# Patient Record
Sex: Female | Born: 1965 | Race: White | Hispanic: No | Smoking: Former smoker
Health system: Southern US, Community
[De-identification: ages and names within clinical notes are randomized; demographics above are authoritative.]

---

## 1997-03-17 ENCOUNTER — Inpatient Hospital Stay (HOSPITAL_COMMUNITY): Admission: AD | Admit: 1997-03-17 | Discharge: 1997-03-19 | Payer: Self-pay | Admitting: Gynecology

## 1997-03-25 ENCOUNTER — Encounter: Admission: RE | Admit: 1997-03-25 | Discharge: 1997-04-11 | Payer: Self-pay | Admitting: Obstetrics and Gynecology

## 1998-05-04 ENCOUNTER — Other Ambulatory Visit: Admission: RE | Admit: 1998-05-04 | Discharge: 1998-05-04 | Payer: Self-pay | Admitting: Gynecology

## 2002-10-01 ENCOUNTER — Other Ambulatory Visit: Admission: RE | Admit: 2002-10-01 | Discharge: 2002-10-01 | Payer: Self-pay | Admitting: Gynecology

## 2003-04-08 ENCOUNTER — Inpatient Hospital Stay (HOSPITAL_COMMUNITY): Admission: AD | Admit: 2003-04-08 | Discharge: 2003-04-12 | Payer: Self-pay | Admitting: Gynecology

## 2003-04-09 ENCOUNTER — Encounter (INDEPENDENT_AMBULATORY_CARE_PROVIDER_SITE_OTHER): Payer: Self-pay | Admitting: Specialist

## 2003-05-23 ENCOUNTER — Other Ambulatory Visit: Admission: RE | Admit: 2003-05-23 | Discharge: 2003-05-23 | Payer: Self-pay | Admitting: Gynecology

## 2003-11-22 ENCOUNTER — Other Ambulatory Visit: Admission: RE | Admit: 2003-11-22 | Discharge: 2003-11-22 | Payer: Self-pay | Admitting: Gynecology

## 2009-01-16 ENCOUNTER — Ambulatory Visit: Payer: Self-pay | Admitting: Internal Medicine

## 2009-01-16 DIAGNOSIS — R109 Unspecified abdominal pain: Secondary | ICD-10-CM | POA: Insufficient documentation

## 2010-06-29 NOTE — Discharge Summary (Signed)
Katherine Webster, Katherine Webster                         ACCOUNT NO.:  [id number]   MEDICAL RECORD NO.:  [id number]                   PATIENT TYPE:  INP   LOCATION:  9143                                 FACILITY:  [facility]   PHYSICIAN:  Juan H. Lily Peer, M.D.             DATE OF BIRTH:  11-Sep-1965   DATE OF ADMISSION:  04/08/2003  DATE OF DISCHARGE:  04/12/2003                                 DISCHARGE SUMMARY   DISCHARGE DIAGNOSES:  1. Intrauterine pregnancy at 38 weeks delivered.  2. Breech presentation.  3. Undesired fertility.  4. Status post primary low transverse cesarean section and bilateral tubal     ligation modified Pomeroy type by Dr. Loreli Dollar on April 09, 2003.  5. Rh negative.   HISTORY:  This is a 37-years-of-age female gravida 3 para 1 with EDC of  April 21, 2003.  Prenatal course was uncomplicated except for being Rh  negative and received RhoGAM during pregnancy.   HOSPITAL COURSE:  On April 09, 2003 the patient presented in labor.  She  was admitted and was found, however, to be breech presentation and therefore  secondary to same underwent primary low transverse cesarean section and  bilateral tubal ligation modified Pomeroy technique by Dr. Loreli Dollar  on April 09, 2003 without complication.  The patient underwent delivery  of a female, Apgars of 8 and 9, weight of 6 pounds 15 ounces.  Postoperatively  the patient remained afebrile, voiding, in stable condition, and she was  discharged to home on April 12, 2003 and given [facility]  postpartum instructions.   ACCESSORY CLINICAL FINDINGS/LABORATORY DATA:  The patient is O negative,  rubella immune.  On April 10, 2003 hemoglobin was 9.1.   DISPOSITION:  The patient was discharged to home, informed to return to the  office in 6 weeks, if had any problem prior to that time to be seen in the  office, given a prescription for Tylox p.r.n. pain, was to take iron on a  daily basis, and the  patient had received RhoGAM prior to discharge.     Susa Loffler, P.A.                    Juan H. Lily Peer, M.D.    TSG/MEDQ  D:  05/02/2003  T:  05/02/2003  Job:  [id number]

## 2010-06-29 NOTE — Op Note (Signed)
Katherine Webster, Katherine Webster                         ACCOUNT NO.:  [id number]   MEDICAL RECORD NO.:  [id number]                   PATIENT TYPE:  INP   LOCATION:  9143                                 FACILITY:  [facility]   PHYSICIAN:  Charles A. Sydnee Cabal, MD            DATE OF BIRTH:  1965/11/16   DATE OF PROCEDURE:  04/09/2003  DATE OF DISCHARGE:                                 OPERATIVE REPORT   PREOPERATIVE DIAGNOSES:  1. Intrauterine pregnancy 38-1/2 weeks.  2. Breech lie.  3. Undesired fertility.   POSTOPERATIVE DIAGNOSES:  1. Intrauterine pregnancy 38-1/2 weeks.  2. Breech lie.  3. Undesired fertility.   PROCEDURES:  1. Primary low transverse cesarean section.  2. Bilateral tubal ligation, modified Pomeroy type.   SURGEON:  Charles A. Sydnee Cabal, MD.   ASSISTANT:  Conni Elliot, M.D.   COMPLICATIONS:  None.   ESTIMATED BLOOD LOSS:  700 mL.   OPERATIVE FINDINGS:  Vigorous female, Apgars 8 and 9, normal tubes and  ovaries.  Very small fundal leiomyoma.   ANESTHESIA:  Epidural/Duramorph.   INSTRUMENT, SPONGE AND NEEDLE COUNT:  Correct x 2.   DESCRIPTION OF PROCEDURE:  The patient was taken to the operating room,  placed in supine position.  Anesthesia had been dosed and was adequate.  Sterile prep and drape was undertaken.  Pfannenstiel incision was made with  a knife, carried down to fascia.  Fascia was incised with the knife and Mayo  scissors.  Rectus sheath were sharply dissected superiorly and inferiorly.  Rectus muscles were sharply dissected in the midline; peritoneum was entered  with a hemostat, and peritoneum was stretched to extend the incision.  Bladder blade was placed.  The vesicouterine peritoneum was incised with the  Metzenbaum scissors.  Blunt dissection was used to develop the bladder flap.  Bladder blade was re-placed in the lower uterine segment.  Transverse  incision was made to amniotomy.  Traction was used to extend the incision;  hand was inserted.   Breech was lifted out of the operative field, and fundal  pressure by the operator's assistant pushed the baby out to the chest level.  The legs were flexed and delivered without difficulty.  The baby was rotated  on its reflex without difficulty, and the head spontaneously delivered.  Cord was clamped and cut, and infant was handed off to neonatologist in  attendance.  Manual expression of the placenta was effected.  Placenta was  removed, and the intrauterine cavity was wiped with moistened lap.  Then #1  chromic was used in a running lock suture, first layer then second layer  running imbricating, nonlocking over the first.  Figure-of-eight suture was  used at the left angle as well as the right angle to achieve adequate  hemostasis using 0 Vicryl.  Hemostasis was verified, and paracolic gutters  were cleansed of clotted blood material, and incisional hemostasis was  verified.  Bladder flap hemostasis was  good.  Attention was then turned to  the tubal ligation.  Left tube was grasped with Babcock forceps; middle  third was ligated with 0 plain gut, excised, and handed off to nursing to go  to pathology.  Pedicle was of good hemostasis with minor electrocautery on  the tube ends.  Attention was turned to the right tube.  In like fashion,  middle third was grasped with Babcock forceps.  Two ties of 0 plain gut were  placed.  Tubal segment was excised.  Cautery was used on the tubal ends, and  segment was handed off to nursing to go to pathology.  Pedicles were  revisualized.  Hemostasis was good.  Incisional hemostasis was good.  Subfascial hemostasis was good.  The fascia was closed with #1 Vicryl  running nonlocking suture.  Subcutaneous hemostasis, was irrigated.  Bladder  flap had been irrigated as well earlier.  Prior to closure of the fascia,  hemostasis was good in the subcutaneous tissues, and sterile skin clips were  used to close the skin.  Sterile dressing was applied.  The  patient was  taken from the operating room with physician in attendance.                                               Charles A. Sydnee Cabal, MD    CAD/MEDQ  D:  04/09/2003  T:  04/09/2003  Job:  [id number]

## 2011-04-10 ENCOUNTER — Other Ambulatory Visit: Payer: Self-pay | Admitting: Obstetrics and Gynecology

## 2017-02-11 HISTORY — PX: PLACEMENT OF BREAST IMPLANTS: SHX6334

## 2017-02-11 HISTORY — PX: LIPOSUCTION: SHX10

## 2021-01-23 ENCOUNTER — Encounter: Payer: Self-pay | Admitting: Student

## 2021-01-23 ENCOUNTER — Other Ambulatory Visit: Payer: Self-pay

## 2021-01-23 ENCOUNTER — Ambulatory Visit (INDEPENDENT_AMBULATORY_CARE_PROVIDER_SITE_OTHER): Payer: Self-pay | Admitting: Student

## 2021-01-23 ENCOUNTER — Ambulatory Visit (INDEPENDENT_AMBULATORY_CARE_PROVIDER_SITE_OTHER): Payer: Managed Care, Other (non HMO)

## 2021-01-23 VITALS — BP 124/66 | HR 77 | Temp 98.1°F | Ht 63.0 in | Wt 215.4 lb

## 2021-01-23 DIAGNOSIS — R0609 Other forms of dyspnea: Secondary | ICD-10-CM | POA: Diagnosis not present

## 2021-01-23 NOTE — Patient Instructions (Addendum)
-   PFTs and clinic visit in 1-2 weeks - CXR and lab today

## 2021-01-23 NOTE — Progress Notes (Signed)
Synopsis: Referred for DOE by No ref. provider found  Subjective:   PATIENT ID: Katherine Webster GENDER: female DOB: 1965/10/18, MRN: [id number]  Chief Complaint  Patient presents with   Pulmonary Consult    Referred by Dr Marcelle Overlie. Pt c/o DOE x 3 years. She gets winded with exertion such as mowing her yard. She walks on her treadmill about 3 x per wk. She has wheezing when she lies down.    55yF with no significant PMH who is here for DOE. Never had asthma. Never had covid-19 infection.  Three years of DOE with strenuous exertion. On treadmill 30 min/day walking and takes it pretty easy due to fear of DOE with strenuous exertion. Has some wheezing when she lies down. She has no cough. She has no heartburn/indigestion. No throat tightness or cough in association with her DOE, palpitations, CP. No orthopnea in general.   She doesn't snore. No PND. No excessive daytime sleepiness.   Otherwise pertinent review of systems is negative.  No family history of lung disease  She works from home. She has never lived outside of [place]. No recent travel. She has 2 cats. She smoked for 12 years half ppd, quit 1995.     No past medical history on file.   Family History  Problem Relation Age of Onset   Breast cancer Maternal Aunt    Bladder Cancer Maternal Aunt      Past Surgical History:  Procedure Laterality Date   LIPOSUCTION N/A 02/11/2017   PLACEMENT OF BREAST IMPLANTS Bilateral 02/11/2017    Social History   Socioeconomic History   Marital status: Widowed    Spouse name: Not on file   Number of children: Not on file   Years of education: Not on file   Highest education level: Not on file  Occupational History   Not on file  Tobacco Use   Smoking status: Former    Packs/day: 0.50    Years: 12.00    Pack years: 6.00    Types: Cigarettes    Quit date: 02/11/1993    Years since quitting: 27.9   Smokeless tobacco: Never  Vaping Use   Vaping Use: Never used  Substance  and Sexual Activity   Alcohol use: Not on file   Drug use: Not on file   Sexual activity: Not on file  Other Topics Concern   Not on file  Social History Narrative   Not on file   Social Determinants of Health   Financial Resource Strain: Not on file  Food Insecurity: Not on file  Transportation Needs: Not on file  Physical Activity: Not on file  Stress: Not on file  Social Connections: Not on file  Intimate Partner Violence: Not on file     Allergies  Allergen Reactions   Erythromycin      Outpatient Medications Prior to Visit  Medication Sig Dispense Refill   acetaminophen (TYLENOL) 325 MG tablet Take 650 mg by mouth every 6 (six) hours as needed.     No facility-administered medications prior to visit.       Objective:   Physical Exam:  General appearance: 55 y.o., female, NAD, conversant  Eyes: anicteric sclerae; PERRL, tracking appropriately HENT: NCAT; MMM Neck: Trachea midline; no lymphadenopathy, no JVD Lungs: CTAB, no crackles, no wheeze, with normal respiratory effort CV: RRR, no murmur  Abdomen: Soft, non-tender; non-distended, BS present  Extremities: 1+ BLE nonpitting edema, warm Skin: Normal turgor and texture; no rash Psych: Appropriate affect  Neuro: Alert and oriented to person and place, no focal deficit     Vitals:   01/23/21 1612  BP: 124/66  Pulse: 77  Temp: 98.1 F (36.7 C)  TempSrc: Oral  SpO2: 99%  Weight: 215 lb 6.4 oz (97.7 kg)  Height: 5\' 3"  (1.6 m)   99% on RA BMI Readings from Last 3 Encounters:  01/23/21 38.16 kg/m   Wt Readings from Last 3 Encounters:  01/23/21 215 lb 6.4 oz (97.7 kg)     CBC No results found for: WBC, RBC, HGB, HCT, PLT, MCV, MCH, MCHC, RDW, LYMPHSABS, MONOABS, EOSABS, BASOSABS  11/21 Hb 13, Eos 300, bicarb 24, S Cr 0.99, TSH and fT4 WNL  Chest Imaging: CXR today reviewed by me unremarkable, final read pending  Pulmonary Functions Testing Results: No flowsheet data found.       Assessment & Plan:   # DOE: May have exercise related asthma, deconditioning. Her recording of 'wheezing' during an episode of DOE following strenuous exercise to me raises concern for upper airway obstruction issue like VCD, exercise-induced laryngeal obstruction with loud wheeze that is audible from phone microphone held over her. She does not have other symptoms that raise concern for untreated sleep apnea.  Plan: - BNP - PFTs, methacholine vs exercise bronchoprovocation if negative - consideration of speech/ENT evaluation if above unrevealing  RTC 1-2 weeks following PFT to discuss results with APP or myself   12/21, MD [facility] 01/23/2021 5:35 PM

## 2021-01-24 ENCOUNTER — Other Ambulatory Visit (INDEPENDENT_AMBULATORY_CARE_PROVIDER_SITE_OTHER): Payer: Managed Care, Other (non HMO)

## 2021-01-24 DIAGNOSIS — R0609 Other forms of dyspnea: Secondary | ICD-10-CM | POA: Diagnosis not present

## 2021-01-24 LAB — BRAIN NATRIURETIC PEPTIDE: Pro B Natriuretic peptide (BNP): 39 pg/mL (ref 0.0–100.0)

## 2021-01-31 ENCOUNTER — Ambulatory Visit (INDEPENDENT_AMBULATORY_CARE_PROVIDER_SITE_OTHER): Payer: Managed Care, Other (non HMO) | Admitting: Student

## 2021-01-31 ENCOUNTER — Other Ambulatory Visit: Payer: Self-pay

## 2021-01-31 DIAGNOSIS — R0609 Other forms of dyspnea: Secondary | ICD-10-CM

## 2021-01-31 LAB — PULMONARY FUNCTION TEST
FEF 25-75 Post: 3.52 L/sec
FEF 25-75 Pre: 3.37 L/sec
FEF2575-%Change-Post: 4 %
FEF2575-%Pred-Post: 142 %
FEF2575-%Pred-Pre: 136 %
FEV1-%Change-Post: -1 %
FEV1-%Pred-Post: 97 %
FEV1-%Pred-Pre: 98 %
FEV1-Post: 2.49 L
FEV1-Pre: 2.52 L
FEV1FVC-%Change-Post: 2 %
FEV1FVC-%Pred-Pre: 111 %
FEV6-%Change-Post: -3 %
FEV6-%Pred-Post: 86 %
FEV6-%Pred-Pre: 89 %
FEV6-Post: 2.77 L
FEV6-Pre: 2.86 L
FEV6FVC-%Pred-Post: 103 %
FEV6FVC-%Pred-Pre: 103 %
FVC-%Change-Post: -3 %
FVC-%Pred-Post: 84 %
FVC-%Pred-Pre: 87 %
FVC-Post: 2.77 L
FVC-Pre: 2.86 L
Post FEV1/FVC ratio: 90 %
Post FEV6/FVC ratio: 100 %
Pre FEV1/FVC ratio: 88 %
Pre FEV6/FVC Ratio: 100 %

## 2021-01-31 NOTE — Progress Notes (Signed)
Spirometry pre and post done today. 

## 2021-02-07 NOTE — Progress Notes (Signed)
Synopsis: Referred for DOE by Marcelle Overlie, MD  Subjective:   PATIENT ID: Katherine Webster GENDER: female DOB: [birth year], MRN: [id number]  Chief Complaint  Patient presents with   Follow-up    Breathing is unchanged since the last visit. Review PFT's today.    55yF with no significant PMH who is here for DOE. Never had asthma. Never had covid-19 infection.  Three years of DOE with strenuous exertion. On treadmill 30 min/day walking and takes it pretty easy due to fear of DOE with strenuous exertion. Has some wheezing when she lies down. She has no cough. She has no heartburn/indigestion. No throat tightness or cough in association with her DOE, palpitations, CP. No orthopnea in general.   She doesn't snore. No PND. No excessive daytime sleepiness.   No family history of lung disease  She works from home. She has never lived outside of [place]. No recent travel. She has 2 cats. She smoked for 12 years half ppd, quit 1995.   Interval HPI: Last seen by me 01/24/21 at which point recommended PFTs which were normal - no obstruction, bronchodilator response, nor flow-volume loop abnormality. No major issues since last visit but she's been holding off on strenuous activity.   Otherwise pertinent review of systems is negative.    No past medical history on file.   Family History  Problem Relation Age of Onset   Breast cancer Maternal Aunt    Bladder Cancer Maternal Aunt      Past Surgical History:  Procedure Laterality Date   LIPOSUCTION N/A 02/11/2017   PLACEMENT OF BREAST IMPLANTS Bilateral 02/11/2017    Social History   Socioeconomic History   Marital status: Widowed    Spouse name: Not on file   Number of children: Not on file   Years of education: Not on file   Highest education level: Not on file  Occupational History   Not on file  Tobacco Use   Smoking status: Former    Packs/day: 0.50    Years: 12.00    Pack years: 6.00    Types: Cigarettes    Quit date:  02/11/1993    Years since quitting: 28.0   Smokeless tobacco: Never  Vaping Use   Vaping Use: Never used  Substance and Sexual Activity   Alcohol use: Not on file   Drug use: Not on file   Sexual activity: Not on file  Other Topics Concern   Not on file  Social History Narrative   Not on file   Social Determinants of Health   Financial Resource Strain: Not on file  Food Insecurity: Not on file  Transportation Needs: Not on file  Physical Activity: Not on file  Stress: Not on file  Social Connections: Not on file  Intimate Partner Violence: Not on file     Allergies  Allergen Reactions   Erythromycin      Outpatient Medications Prior to Visit  Medication Sig Dispense Refill   acetaminophen (TYLENOL) 325 MG tablet Take 650 mg by mouth every 6 (six) hours as needed.     No facility-administered medications prior to visit.       Objective:   Physical Exam:  General appearance: 55 y.o., female, NAD, conversant  Eyes: anicteric sclerae; PERRL, tracking appropriately HENT: NCAT; MMM Neck: Trachea midline; no lymphadenopathy, no JVD Lungs: CTAB, no crackles, no wheeze, with normal respiratory effort CV: RRR, no murmur  Abdomen: Soft, non-tender; non-distended, BS present  Extremities: 1+ BLE nonpitting edema, warm  Skin: Normal turgor and texture; no rash Psych: Appropriate affect Neuro: Alert and oriented to person and place, no focal deficit     Vitals:   02/08/21 1213  BP: 126/74  Pulse: 76  Temp: 98.2 F (36.8 C)  TempSrc: Oral  SpO2: 99%  Weight: 211 lb 6.4 oz (95.9 kg)  Height: 5\' 3"  (1.6 m)    99% on RA BMI Readings from Last 3 Encounters:  02/08/21 37.45 kg/m  01/23/21 38.16 kg/m   Wt Readings from Last 3 Encounters:  02/08/21 211 lb 6.4 oz (95.9 kg)  01/23/21 215 lb 6.4 oz (97.7 kg)     CBC No results found for: WBC, RBC, HGB, HCT, PLT, MCV, MCH, MCHC, RDW, LYMPHSABS, MONOABS, EOSABS, BASOSABS  11/21 Hb 13, Eos 300, bicarb 24, S Cr  0.99, TSH and fT4 WNL  Chest Imaging: CXR 01/23/21 reviewed by me unremarkable  Pulmonary Functions Testing Results: PFT Results Latest Ref Rng & Units 01/31/2021  FVC-Pre L 2.86  FVC-Predicted Pre % 87  FVC-Post L 2.77  FVC-Predicted Post % 84  Pre FEV1/FVC % % 88  Post FEV1/FCV % % 90  FEV1-Pre L 2.52  FEV1-Predicted Pre % 98  FEV1-Post L 2.49        Assessment & Plan:   # DOE: May have exercise related asthma, deconditioning. Her recording of 'wheezing' during an episode of DOE following strenuous exercise to me raises concern for upper airway obstruction issue like VCD, exercise-induced laryngeal obstruction with loud wheeze that is audible from phone microphone held over her. She does not have other symptoms that raise concern for untreated sleep apnea.  Plan: - methacholine vs exercise bronchoprovocation if negative and still symptomatic then speech/ENT evaluation      02/02/2021, MD [facility] 02/08/2021 1:26 PM

## 2021-02-08 ENCOUNTER — Ambulatory Visit (INDEPENDENT_AMBULATORY_CARE_PROVIDER_SITE_OTHER): Payer: Managed Care, Other (non HMO) | Admitting: Student

## 2021-02-08 ENCOUNTER — Encounter: Payer: Self-pay | Admitting: Student

## 2021-02-08 ENCOUNTER — Other Ambulatory Visit: Payer: Self-pay

## 2021-02-08 VITALS — BP 126/74 | HR 76 | Temp 98.2°F | Ht 63.0 in | Wt 211.4 lb

## 2021-02-08 DIAGNOSIS — R0609 Other forms of dyspnea: Secondary | ICD-10-CM

## 2021-02-08 NOTE — Patient Instructions (Signed)
-   You will be called to schedule bronchoprovocation test for asthma or exercise induced laryngeal obstruction (EILO)

## 2021-02-27 ENCOUNTER — Encounter: Payer: Managed Care, Other (non HMO) | Attending: Obstetrics and Gynecology | Admitting: Registered"

## 2021-02-27 ENCOUNTER — Encounter: Payer: Self-pay | Admitting: Registered"

## 2021-02-27 ENCOUNTER — Other Ambulatory Visit: Payer: Self-pay

## 2021-02-27 DIAGNOSIS — Z713 Dietary counseling and surveillance: Secondary | ICD-10-CM | POA: Insufficient documentation

## 2021-02-27 NOTE — Progress Notes (Signed)
Medical Nutrition Therapy  Appointment Start time:  11:30  Appointment End time:  12:34  Primary concerns today: knowledge of what else she can do  Referral diagnosis: abnormal weight gain Preferred learning style: no preference indicated Learning readiness: ready, change in progress   NUTRITION ASSESSMENT   States she is experiencing weight gain as a result of menopause and going through a period of time where she was also working late hours, not eating great, not physically active, and taking care of her mom. States she gained 20 lbs in 1 year. States she is taking phentermine, started 01/2021. States she has some breathing challenges when exerting energy while mowing the lawn or exercising; seeing a specialist.   States she tries balance meals. Reports she likes sweets but does not have them in her home. States she drinks mostly water and enjoys milk. States she has challenges of eating more than needed and trying to listen to her body now. States she used to think about food often but since taking phentermine she has not been thinking about food often. Reports childhood food insecurity and has challenges throwing food away at times or eating to clean plate.   States she has 2 children (ages 55 and 43) and does not get out often. States she has been working from home for 5 years. States she used to walk a lot.    Clinical Medical Hx: none reported Medications: See list Labs: all within normal limits Notable Signs/Symptoms: shortness of breath when exercising  Lifestyle & Dietary Hx  Estimated daily fluid intake: 40+ oz Supplements: See list Sleep: 7 hours/night Stress / self-care: home projects, cleaning home,  Current average weekly physical activity: walking on treadmill on treadmill 30 min, 5 days/week  24-Hr Dietary Recall First Meal: typically skips; coffee or 1/2 waffle + egg + watermelon Snack:  Second Meal: mac and cheese (pepper, onions, tomatoes, beef) Snack:  Third  Meal: mac and cheese (pepper, onions, tomatoes, beef) Snack:  Beverages: coffee (8 oz), water (2*16 oz, 32 oz), milk (sometimes)   NUTRITION DIAGNOSIS  NB-1.1 Food and nutrition-related knowledge deficit As related to balanced eating.  As evidenced by dietary recall.   NUTRITION INTERVENTION  Nutrition education (E-1) on the following topics: Nutrition education and counseling. Pt was educated on the benefits of eating a variety of food groups at each meal. Discussed the purpose of each food group and ways to create balance with already established regimen. Discussed eating 3 meals/day to help adequately nourish body and ways to increase water intake. Discussed importance of increasing vegetable intake. Discussed A1C values and reviewed recent lab results. Pt agreed with goals listed.   Handouts Provided Include  None provided  Learning Style & Readiness for Change Teaching method utilized: Visual & Auditory  Demonstrated degree of understanding via: Teach Back  Barriers to learning/adherence to lifestyle change: none identified  Goals Established by Pt Increase water intake to 4 glasses a day. Have one glass with each meal and one while working.  Aim to have balanced breakfast daily to include a source of carbohydrates + protein such as  Yogurt  Cottage cheese +  fruit Increase vegetables intake with meals.    MONITORING & EVALUATION Dietary intake, weekly physical activity.  Next Steps  Patient is to follow-up prn.

## 2021-02-27 NOTE — Patient Instructions (Signed)
-   Increase water intake to 4 glasses a day. Have one glass with each meal and one while working.   - Aim to have balanced breakfast daily to include a source of carbohydrates + protein such as  Yogurt  Cottage cheese +  fruit  - Increase vegetables intake with meals.

## 2021-03-06 ENCOUNTER — Other Ambulatory Visit: Payer: Self-pay | Admitting: Student

## 2021-03-06 LAB — SARS CORONAVIRUS 2 (TAT 6-24 HRS): SARS Coronavirus 2: NEGATIVE

## 2021-03-08 ENCOUNTER — Ambulatory Visit (HOSPITAL_COMMUNITY)
Admission: RE | Admit: 2021-03-08 | Discharge: 2021-03-08 | Disposition: A | Discharge status: Home / Self Care | Payer: Managed Care, Other (non HMO) | Source: Ambulatory Visit | Attending: Student | Admitting: Student

## 2021-03-08 ENCOUNTER — Other Ambulatory Visit: Payer: Self-pay

## 2021-03-08 DIAGNOSIS — R0609 Other forms of dyspnea: Secondary | ICD-10-CM | POA: Insufficient documentation

## 2021-03-08 LAB — PULMONARY FUNCTION TEST
FEF 25-75 Post: 3.04 L/sec
FEF 25-75 Pre: 3.33 L/sec
FEF2575-%Change-Post: -8 %
FEF2575-%Pred-Post: 123 %
FEF2575-%Pred-Pre: 135 %
FEV1-%Change-Post: 0 %
FEV1-%Pred-Post: 95 %
FEV1-%Pred-Pre: 96 %
FEV1-Post: 2.44 L
FEV1-Pre: 2.47 L
FEV1FVC-%Change-Post: 0 %
FEV1FVC-%Pred-Pre: 107 %
FEV6-%Change-Post: -2 %
FEV6-%Pred-Post: 89 %
FEV6-%Pred-Pre: 91 %
FEV6-Post: 2.84 L
FEV6-Pre: 2.9 L
FEV6FVC-%Change-Post: 0 %
FEV6FVC-%Pred-Post: 103 %
FEV6FVC-%Pred-Pre: 103 %
FVC-%Change-Post: -1 %
FVC-%Pred-Post: 86 %
FVC-%Pred-Pre: 88 %
FVC-Post: 2.85 L
FVC-Pre: 2.9 L
Post FEV1/FVC ratio: 86 %
Post FEV6/FVC ratio: 100 %
Pre FEV1/FVC ratio: 85 %
Pre FEV6/FVC Ratio: 100 %

## 2021-03-08 MED ORDER — METHACHOLINE 4 MG/ML NEB SOLN
3.0000 mL | Freq: Once | RESPIRATORY_TRACT | Status: AC
  Administered 2021-03-08: 12 mg via RESPIRATORY_TRACT
  Filled 2021-03-08: qty 3

## 2021-03-08 MED ORDER — METHACHOLINE 16 MG/ML NEB SOLN
3.0000 mL | Freq: Once | RESPIRATORY_TRACT | Status: AC
  Administered 2021-03-08: 48 mg via RESPIRATORY_TRACT
  Filled 2021-03-08: qty 3

## 2021-03-08 MED ORDER — METHACHOLINE 0.25 MG/ML NEB SOLN
3.0000 mL | Freq: Once | RESPIRATORY_TRACT | Status: AC
  Administered 2021-03-08: 0.75 mg via RESPIRATORY_TRACT
  Filled 2021-03-08: qty 3

## 2021-03-08 MED ORDER — ALBUTEROL SULFATE (2.5 MG/3ML) 0.083% IN NEBU
2.5000 mg | INHALATION_SOLUTION | Freq: Once | RESPIRATORY_TRACT | Status: AC
  Administered 2021-03-08: 2.5 mg via RESPIRATORY_TRACT

## 2021-03-08 MED ORDER — METHACHOLINE 1 MG/ML NEB SOLN
3.0000 mL | Freq: Once | RESPIRATORY_TRACT | Status: AC
  Administered 2021-03-08: 3 mg via RESPIRATORY_TRACT
  Filled 2021-03-08: qty 3

## 2021-03-08 MED ORDER — METHACHOLINE 0 MG/ML NEB SOLN
3.0000 mL | Freq: Once | RESPIRATORY_TRACT | Status: AC
  Administered 2021-03-08: 3 mL via RESPIRATORY_TRACT
  Filled 2021-03-08: qty 3

## 2021-03-08 MED ORDER — METHACHOLINE 0.0625 MG/ML NEB SOLN
3.0000 mL | Freq: Once | RESPIRATORY_TRACT | Status: AC
  Administered 2021-03-08: 0.0625 mg via RESPIRATORY_TRACT
  Filled 2021-03-08: qty 3

## 2021-03-20 ENCOUNTER — Other Ambulatory Visit: Payer: Self-pay | Admitting: Obstetrics and Gynecology

## 2021-03-20 DIAGNOSIS — R928 Other abnormal and inconclusive findings on diagnostic imaging of breast: Secondary | ICD-10-CM

## 2021-03-26 NOTE — Progress Notes (Signed)
Synopsis: Referred for DOE by No ref. provider found  Subjective:   PATIENT ID: Katherine Webster GENDER: female DOB: 1965/11/25, MRN: [id number]  Chief Complaint  Patient presents with   Follow-up    Review MCT. She states her breathing is unchanged since the last visit.    55yF with no significant PMH who is here for DOE. Never had asthma. Never had covid-19 infection.  Three years of DOE with strenuous exertion. On treadmill 30 min/day walking and takes it pretty easy due to fear of DOE with strenuous exertion. Has some wheezing when she lies down. She has no cough. She has no heartburn/indigestion. No throat tightness or cough in association with her DOE, palpitations, CP. No orthopnea in general.   She doesn't snore. No PND. No excessive daytime sleepiness.   No family history of lung disease  She works from home. She has never lived outside of [place]. No recent travel. She has 2 cats. She smoked for 12 years half ppd, quit 1995.   Interval HPI: Last seen by me 02/08/21, pursued methacholine challenge, negative for AHR.   Hasn't had another episode of DOE/wheeze with strenuous exertion but as at last visit she's still hesitant to do strenuous exercise. No significant cough.  Otherwise pertinent review of  systems is negative.    No past medical history on file.   Family History  Problem Relation Age of Onset   Breast cancer Maternal Aunt    Bladder Cancer Maternal Aunt      Past Surgical History:  Procedure Laterality Date   LIPOSUCTION N/A 02/11/2017   PLACEMENT OF BREAST IMPLANTS Bilateral 02/11/2017    Social History   Socioeconomic History   Marital status: Widowed    Spouse name: Not on file   Number of children: Not on file   Years of education: Not on file   Highest education level: Not on file  Occupational History   Not on file  Tobacco Use   Smoking status: Former    Packs/day: 0.50    Years: 12.00    Pack years: 6.00    Types: Cigarettes     Quit date: 02/11/1993    Years since quitting: 28.1   Smokeless tobacco: Never  Vaping Use   Vaping Use: Never used  Substance and Sexual Activity   Alcohol use: Not on file   Drug use: Not on file   Sexual activity: Not on file  Other Topics Concern   Not on file  Social History Narrative   Not on file   Social Determinants of Health   Financial Resource Strain: Not on file  Food Insecurity: No Food Insecurity   Worried About Running Out of Food in the Last Year: Never true   Ran Out of Food in the Last Year: Never true  Transportation Needs: Not on file  Physical Activity: Not on file  Stress: Not on file  Social Connections: Not on file  Intimate Partner Violence: Not on file     Allergies  Allergen Reactions   Erythromycin      Outpatient Medications Prior to Visit  Medication Sig Dispense Refill   acetaminophen (TYLENOL) 325 MG tablet Take 650 mg by mouth every 6 (six) hours as needed.     No facility-administered medications prior to visit.       Objective:   Physical Exam:  General appearance: 56 y.o., female, NAD, conversant  Eyes: anicteric sclerae; PERRL, tracking appropriately HENT: NCAT; MMM Neck: Trachea midline; no lymphadenopathy,  no JVD Lungs: CTAB, no crackles, no wheeze, with normal respiratory effort CV: RRR, no murmur  Abdomen: Soft, non-tender; non-distended, BS present  Extremities: 1+ BLE nonpitting edema, warm Skin: Normal turgor and texture; no rash Psych: Appropriate affect Neuro: Alert and oriented to person and place, no focal deficit     Vitals:   03/27/21 1651  BP: 124/66  Pulse: 77  Temp: 98.1 F (36.7 C)  TempSrc: Oral  SpO2: 98%  Weight: 209 lb 12.8 oz (95.2 kg)  Height: 5\' 3"  (1.6 m)     98% on RA BMI Readings from Last 3 Encounters:  03/27/21 37.16 kg/m  02/08/21 37.45 kg/m  01/23/21 38.16 kg/m   Wt Readings from Last 3 Encounters:  03/27/21 209 lb 12.8 oz (95.2 kg)  02/08/21 211 lb 6.4 oz (95.9 kg)   01/23/21 215 lb 6.4 oz (97.7 kg)     CBC No results found for: WBC, RBC, HGB, HCT, PLT, MCV, MCH, MCHC, RDW, LYMPHSABS, MONOABS, EOSABS, BASOSABS  11/21 Hb 13, Eos 300, bicarb 24, S Cr 0.99, TSH and fT4 WNL  Chest Imaging: CXR 01/23/21 reviewed by me unremarkable  Pulmonary Functions Testing Results: PFT Results Latest Ref Rng & Units 03/08/2021 01/31/2021  FVC-Pre L 2.90 2.86  FVC-Predicted Pre % 88 87  FVC-Post L 2.85 2.77  FVC-Predicted Post % 86 84  Pre FEV1/FVC % % 85 88  Post FEV1/FCV % % 86 90  FEV1-Pre L 2.47 2.52  FEV1-Predicted Pre % 96 98  FEV1-Post L 2.44 2.49   03/08/21 Methacholine challenge reviewed by me, negative for AHR. Throat irritation but normal FV loops      Assessment & Plan:   # DOE: May have exercise related asthma, deconditioning. Her recording of 'wheezing' during an episode of DOE following strenuous exercise to me raises concern for upper airway obstruction issue like VCD, exercise-induced laryngeal obstruction with loud wheeze that is audible from phone microphone held over her. She does not have other symptoms that raise concern for untreated sleep apnea.  Plan: - CPEX. If unrevealing and still symptomatic then consideration exercise bronchoprovocation vs speech/ENT evaluation      03/10/21, MD [facility] 03/27/2021 6:24 PM

## 2021-03-27 ENCOUNTER — Other Ambulatory Visit: Payer: Self-pay

## 2021-03-27 ENCOUNTER — Ambulatory Visit (INDEPENDENT_AMBULATORY_CARE_PROVIDER_SITE_OTHER): Payer: Managed Care, Other (non HMO) | Admitting: Student

## 2021-03-27 ENCOUNTER — Encounter: Payer: Self-pay | Admitting: Student

## 2021-03-27 VITALS — BP 124/66 | HR 77 | Temp 98.1°F | Ht 63.0 in | Wt 209.8 lb

## 2021-03-27 DIAGNOSIS — R0609 Other forms of dyspnea: Secondary | ICD-10-CM

## 2021-03-27 NOTE — Patient Instructions (Signed)
You will be called to schedule cardiopulmonary exercise testing. If you have any questions about your results call or send me a my chart message. I'll see you in 6 weeks to discuss.

## 2021-04-04 ENCOUNTER — Other Ambulatory Visit: Payer: Managed Care, Other (non HMO)

## 2021-04-10 ENCOUNTER — Encounter (HOSPITAL_COMMUNITY): Payer: Managed Care, Other (non HMO)

## 2021-04-12 ENCOUNTER — Ambulatory Visit
Admission: RE | Admit: 2021-04-12 | Discharge: 2021-04-12 | Disposition: A | Discharge status: Home / Self Care | Payer: Managed Care, Other (non HMO) | Source: Ambulatory Visit | Attending: Obstetrics and Gynecology | Admitting: Obstetrics and Gynecology

## 2021-04-12 ENCOUNTER — Other Ambulatory Visit: Payer: Self-pay

## 2021-04-12 ENCOUNTER — Other Ambulatory Visit: Payer: Self-pay | Admitting: Obstetrics and Gynecology

## 2021-04-12 DIAGNOSIS — R928 Other abnormal and inconclusive findings on diagnostic imaging of breast: Secondary | ICD-10-CM

## 2021-04-12 DIAGNOSIS — N6310 Unspecified lump in the right breast, unspecified quadrant: Secondary | ICD-10-CM

## 2021-04-17 ENCOUNTER — Encounter (HOSPITAL_COMMUNITY): Payer: Self-pay

## 2021-04-17 ENCOUNTER — Ambulatory Visit (HOSPITAL_COMMUNITY): Payer: Managed Care, Other (non HMO) | Attending: Student

## 2021-04-17 ENCOUNTER — Other Ambulatory Visit: Payer: Self-pay

## 2021-04-17 DIAGNOSIS — R0600 Dyspnea, unspecified: Secondary | ICD-10-CM | POA: Diagnosis not present

## 2021-04-17 DIAGNOSIS — R0609 Other forms of dyspnea: Secondary | ICD-10-CM

## 2021-04-26 ENCOUNTER — Other Ambulatory Visit: Payer: Managed Care, Other (non HMO)

## 2021-05-08 NOTE — Progress Notes (Signed)
? ?Synopsis: Referred for DOE by No ref. provider found ? ?Subjective:  ? ?PATIENT ID: Katherine Webster GENDER: female DOB: 1965-05-16, MRN: [id number] ? ?Chief Complaint  ?Patient presents with  ? Follow-up  ?  Breathing is unchanged and she denies any new co's.   ? ?55yF with no significant PMH who is here for DOE. Never had asthma. Never had covid-19 infection. ? ?Three years of DOE with strenuous exertion. On treadmill 30 min/day walking and takes it pretty easy due to fear of DOE with strenuous exertion. Has some wheezing when she lies down. She has no cough. She has no heartburn/indigestion. No throat tightness or cough in association with her DOE, palpitations, CP. No orthopnea in general.  ? ?She doesn't snore. No PND. No excessive daytime sleepiness.  ? ?No family history of lung disease ? ?She works from home. She has never lived outside of [place]. No recent travel. She has 2 cats. She smoked for 12 years half ppd, quit 1995.  ? ?Interval HPI: ?CPEX with above normal exercise capacity but there is evidence of exercise related bronchoconstriction. Still has scaled back exercise to shorter sessions, lower intensity to avoid her episodic DOE/wheeze.  ? ?Otherwise pertinent review of systems is negative. ? ? ? ?No past medical history on file.  ? ?Family History  ?Problem Relation Age of Onset  ? Breast cancer Maternal Aunt   ? Bladder Cancer Maternal Aunt   ?  ? ?Past Surgical History:  ?Procedure Laterality Date  ? LIPOSUCTION N/A 02/11/2017  ? PLACEMENT OF BREAST IMPLANTS Bilateral 02/11/2017  ? ? ?Social History  ? ?Socioeconomic History  ? Marital status: Widowed  ?  Spouse name: Not on file  ? Number of children: Not on file  ? Years of education: Not on file  ? Highest education level: Not on file  ?Occupational History  ? Not on file  ?Tobacco Use  ? Smoking status: Former  ?  Packs/day: 0.50  ?  Years: 12.00  ?  Pack years: 6.00  ?  Types: Cigarettes  ?  Quit date: 02/11/1993  ?  Years since quitting: 28.2  ?  Smokeless tobacco: Never  ?Vaping Use  ? Vaping Use: Never used  ?Substance and Sexual Activity  ? Alcohol use: Not on file  ? Drug use: Not on file  ? Sexual activity: Not on file  ?Other Topics Concern  ? Not on file  ?Social History Narrative  ? Not on file  ? ?Social Determinants of Health  ? ?Financial Resource Strain: Not on file  ?Food Insecurity: No Food Insecurity  ? Worried About Charity fundraiser in the Last Year: Never true  ? Ran Out of Food in the Last Year: Never true  ?Transportation Needs: Not on file  ?Physical Activity: Not on file  ?Stress: Not on file  ?Social Connections: Not on file  ?Intimate Partner Violence: Not on file  ?  ? ?Allergies  ?Allergen Reactions  ? Erythromycin   ?  ? ?Outpatient Medications Prior to Visit  ?Medication Sig Dispense Refill  ? acetaminophen (TYLENOL) 325 MG tablet Take 650 mg by mouth every 6 (six) hours as needed.    ? ?No facility-administered medications prior to visit.  ? ? ? ? ? ?Objective:  ? ?Physical Exam: ? ?General appearance: 56 y.o., female, NAD, conversant  ?Eyes: anicteric sclerae; PERRL, tracking appropriately ?HENT: NCAT; MMM ?Neck: Trachea midline; no lymphadenopathy, no JVD ?Lungs: CTAB, no crackles, no wheeze, with normal respiratory  effort ?CV: RRR, no murmur  ?Abdomen: Soft, non-tender; non-distended, BS present  ?Extremities: 1+ BLE nonpitting edema, warm ?Skin: Normal turgor and texture; no rash ?Psych: Appropriate affect ?Neuro: Alert and oriented to person and place, no focal deficit  ? ? ? ?Vitals:  ? 05/09/21 1622  ?BP: 126/72  ?Pulse: 74  ?Temp: 98.3 ?F (36.8 ?C)  ?TempSrc: Oral  ?SpO2: 97%  ?Weight: 209 lb (94.8 kg)  ?Height: 5\' 3"  (1.6 m)  ? ? ? ? ?97% on RA ?BMI Readings from Last 3 Encounters:  ?05/09/21 37.02 kg/m?  ?03/27/21 37.16 kg/m?  ?02/08/21 37.45 kg/m?  ? ?Wt Readings from Last 3 Encounters:  ?05/09/21 209 lb (94.8 kg)  ?03/27/21 209 lb 12.8 oz (95.2 kg)  ?02/08/21 211 lb 6.4 oz (95.9 kg)  ? ? ? ?CBC ?No results found  for: WBC, RBC, HGB, HCT, PLT, MCV, MCH, MCHC, RDW, LYMPHSABS, MONOABS, EOSABS, BASOSABS ? ?11/21 Hb 13, Eos 300, bicarb 24, S Cr 0.99, TSH and fT4 WNL ? ?Chest Imaging: ?CXR 01/23/21 reviewed by me unremarkable ? ?Pulmonary Functions Testing Results: ? ?  Latest Ref Rng & Units 03/08/2021  ?  1:03 PM 01/31/2021  ?  3:12 PM  ?PFT Results  ?FVC-Pre L 2.90   2.86    ?FVC-Predicted Pre % 88   87    ?FVC-Post L 2.85   2.77    ?FVC-Predicted Post % 86   84    ?Pre FEV1/FVC % % 85   88    ?Post FEV1/FCV % % 86   90    ?FEV1-Pre L 2.47   2.52    ?FEV1-Predicted Pre % 96   98    ?FEV1-Post L 2.44   2.49    ? ?03/08/21 Methacholine challenge reviewed by me, negative for AHR. Throat irritation but normal FV loops ? ?CPEX reviewed by me with normal exercise capacity but there is evidence of exercise related bronchoconstriction ? ?   ?Assessment & Plan:  ? 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 ? ?Plan: ?- encouraged warm up before exercise, if in cold then to wear loose-fitting scarf or mask over mouth ?- albuterol 1-2 puffs 5-20 minutes before strenuous exercise with spacer AND can use as rescue after strenuous exercise if develops dyspnea, wheeze ? ? ?RTC prn ? ? ?Maryjane Hurter, MD ?[facility] ?05/09/2021 4:44 PM  ? ?

## 2021-05-09 ENCOUNTER — Ambulatory Visit (INDEPENDENT_AMBULATORY_CARE_PROVIDER_SITE_OTHER): Payer: Managed Care, Other (non HMO) | Admitting: Student

## 2021-05-09 ENCOUNTER — Encounter: Payer: Self-pay | Admitting: Student

## 2021-05-09 VITALS — BP 126/72 | HR 74 | Temp 98.3°F | Ht 63.0 in | Wt 209.0 lb

## 2021-05-09 DIAGNOSIS — J45990 Exercise induced bronchospasm: Secondary | ICD-10-CM

## 2021-05-09 MED ORDER — ALBUTEROL SULFATE HFA 108 (90 BASE) MCG/ACT IN AERS: 2.0000 | INHALATION_SPRAY | Freq: Four times a day (QID) | RESPIRATORY_TRACT | 11 refills | Status: AC | PRN

## 2021-05-09 NOTE — Patient Instructions (Signed)
- Albuterol 1-2 puffs 5-20 minutes before strenuous exercise or can use after exercise 1-4 puffs if you need it ?- send a message or call if you're doing any worse! ? ?Exercise-Induced Bronchoconstriction, Adult ?Exercise-induced bronchospasm (EIB) happens when the airways narrow during or after vigorous activity or exercise. The airways are the passages that lead from the nose and mouth down into the lungs. When the airways narrow, this can cause coughing, wheezing, and shortness of breath. This makes it hard to breathe. Anyone can develop EIB, even people who do not have allergies or asthma. With proper treatment, most people with EIB can be active and exercise normally. ?What are the causes? ?The exact cause of this condition is not known. Symptoms are brought on (triggered) by physical activity. EIB can also be triggered by: ?Breathing very cold and dry or hot and humid air. ?Chemicals, such as chlorine in swimming pools, pesticides, or fertilizers. ?Outdoor triggers, such as: Engineer, mining pollution. ?Car exhaust. ?Pollen from grass, trees, or flowers. ?Campfire smoke. ?Indoor triggers, such as: ?Dust. ?Mold. ?Tobacco smoke. ?Cleaning solutions. ?Animal dander. ?What increases the risk? ?You are more likely to develop this condition if you: ?Have asthma. ?Participate in sports that require constant motion, such as basketball, hockey, skiing, and swimming. ?Work outdoors. ?Exercise where there are higher levels of one or more EIB triggers. ?What are the signs or symptoms? ?Symptoms of this condition include: ?Coughing. ?Wheezing. ?Shortness of breath. ?Chest pain or tightness. ?Sore throat. ?Upset stomach. ?Symptoms may worsen after exercise has stopped. ?How is this diagnosed? ?EIB is diagnosed with your medical history and a physical exam. ?You may also have other tests, including: ?Lung function studies (spirometry). ?An exercise test to check for EIB symptoms. ?Allergy tests. ?How is this treated? ?Treatment for EIB  includes preventing symptoms when possible, and treating EIB quickly when symptoms occur. Treatment may include: ?Taking medicine that your health care provider prescribes. Medicine comes in different forms, including: ?Medicines that you breathe in (inhale). These include: ?Steroids. These help to control your symptoms and are usually taken every day. ?Quick relief medicines. These help to quickly relieve your breathing difficulty. ?Medicines that you take by mouth (orally). These help to control allergies and asthma. ?Avoiding triggers. ?Stopping physical activity or exercise to rest. ?Follow these instructions at home: ?Take over-the-counter and prescription medicines only as told by your health care provider. ?Do not use products that contain nicotine or tobacco, such as cigarettes, e-cigarettes, and chewing tobacco. If you need help quitting, ask your health care provider. ?Make changes in your workout as told by your health care provider. Exercise is important to your health and well-being. ?Keep quick relief medicine with you when you are exercising. ?Tell your exercise partners about your condition. Wear a medical ID bracelet. ?If you are planning to exercise alone or in an isolated area, let someone know where you are going and when you will be back. ?You may need to see a health care provider who specializes in allergies (allergist) or the lungs (pulmonologist) for more tests. ?Keep all follow-up visits as told by your health care provider. This is important. ?How is this prevented? ?Take medicines to prevent exercise-induced bronchospasm as told by your health care provider. ?Warm up before starting to play sports or exercise. ?Exercise indoors to avoid outdoor triggers. ?Cover your nose and mouth with a scarf to warm air that is very cold. ?Tell your workout partners or trainer about your condition. Tell them how to help you if you have  an episode. ?Contact a health care provider if: ?You have coughing,  wheezing, or shortness of breath that continues after treatment. ?Your coughing wakes you up at night. ?You have less endurance than you used to. ?Get help right away if: ?Your medicine is not helping you breathe better. ?You cannot catch your breath. ?You pass out. ?Summary ?Exercise-induced bronchospasm (EIB) happens when the airways narrow during or after exercise. ?When the airways narrow, this can cause coughing, wheezing, and shortness of breath. It can be difficult to breathe. ?Take over-the-counter and prescription medicines only as told by your health care provider. ?Make changes in your workout as told by your health care provider. ?Contact a health care provider if you continue to have trouble breathing after treatment. ?This information is not intended to replace advice given to you by your health care provider. Make sure you discuss any questions you have with your health care provider. ?Document Revised: 10/16/2017 Document Reviewed: 10/21/2017 ?Elsevier Patient Education ? [place]. ? ?

## 2021-09-04 ENCOUNTER — Telehealth: Payer: Self-pay | Admitting: Student

## 2021-09-04 NOTE — Telephone Encounter (Signed)
Received letter from [facility] stating possible recall on albuterol inhaler- leaking causing inhaler to be empty. Called and discussed with the pt. She has her inhaler and it's working fine. She received the same letter from [facility] and will f/u them if any issues. Nothing further needed.

## 2022-06-20 NOTE — Progress Notes (Signed)
Synopsis: Referred for DOE by No ref. provider found  Subjective:   PATIENT ID: Katherine Webster GENDER: female DOB: March 10, 1965, MRN: [id number]  Chief Complaint  Patient presents with   Follow-up   57yF with no significant PMH who is here for DOE. Never had asthma. Never had covid-19 infection.  Three years of DOE with strenuous exertion. On treadmill 30 min/day walking and takes it pretty easy due to fear of DOE with strenuous exertion. Has some wheezing when she lies down. She has no cough. She has no heartburn/indigestion. No throat tightness or cough in association with her DOE, palpitations, CP. No orthopnea in general.   She doesn't snore. No PND. No excessive daytime sleepiness.   No family history of lung disease  She works from home. She has never lived outside of [place]. No recent travel. She has 2 cats. She smoked for 12 years half ppd, quit 1995.   Interval HPI: CPEX with above normal exercise capacity but there is evidence of exercise related bronchoconstriction. Still has scaled back exercise to shorter sessions, lower intensity to avoid her episodic DOE/wheeze.  --------------------------------------------------- Increased dyspnea recently, wheezing at night that woke her up. Has started riding bike since January. Since March she's had this worsening in her symptoms. She does feel better since starting symbicort.   She has no sinus issues.   Otherwise pertinent review of systems is negative.    No past medical history on file.   Family History  Problem Relation Age of Onset   Breast cancer Maternal Aunt    Bladder Cancer Maternal Aunt      Past Surgical History:  Procedure Laterality Date   LIPOSUCTION N/A 02/11/2017   PLACEMENT OF BREAST IMPLANTS Bilateral 02/11/2017    Social History   Socioeconomic History   Marital status: Widowed    Spouse name: Not on file   Number of children: Not on file   Years of education: Not on file   Highest education  level: Not on file  Occupational History   Not on file  Tobacco Use   Smoking status: Former    Packs/day: 0.50    Years: 12.00    Additional pack years: 0.00    Total pack years: 6.00    Types: Cigarettes    Quit date: 02/11/1993    Years since quitting: 29.3   Smokeless tobacco: Never  Vaping Use   Vaping Use: Never used  Substance and Sexual Activity   Alcohol use: Not on file   Drug use: Not on file   Sexual activity: Not on file  Other Topics Concern   Not on file  Social History Narrative   Not on file   Social Determinants of Health   Financial Resource Strain: Not on file  Food Insecurity: No Food Insecurity (02/27/2021)   Hunger Vital Sign    Worried About Running Out of Food in the Last Year: Never true    Ran Out of Food in the Last Year: Never true  Transportation Needs: Not on file  Physical Activity: Not on file  Stress: Not on file  Social Connections: Not on file  Intimate Partner Violence: Not on file     Allergies  Allergen Reactions   Erythromycin      Outpatient Medications Prior to Visit  Medication Sig Dispense Refill   acetaminophen (TYLENOL) 325 MG tablet Take 650 mg by mouth every 6 (six) hours as needed.     albuterol (VENTOLIN HFA) 108 (90 Base) MCG/ACT  inhaler Inhale 2 puffs into the lungs every 6 (six) hours as needed for wheezing or shortness of breath. 8 g 11   budesonide-formoterol (SYMBICORT) 160-4.5 MCG/ACT inhaler Inhale 2 puffs into the lungs.     tirzepatide (ZEPBOUND) 2.5 MG/0.5ML Pen Inject 2.5 mg into the skin once a week. 2 mL 2   No facility-administered medications prior to visit.       Objective:   Physical Exam:  General appearance: 57 y.o., female, NAD, conversant  Eyes: anicteric sclerae; PERRL, tracking appropriately HENT: NCAT; MMM Neck: Trachea midline; no lymphadenopathy, no JVD Lungs: CTAB, no crackles, no wheeze, with normal respiratory effort CV: RRR, no murmur  Abdomen: Soft, non-tender;  non-distended, BS present  Extremities: 1+ BLE nonpitting edema, warm Skin: Normal turgor and texture; no rash Psych: Appropriate affect Neuro: Alert and oriented to person and place, no focal deficit     Vitals:   06/24/22 0956  BP: 116/72  Pulse: 72  SpO2: 97%  Weight: 203 lb (92.1 kg)  Height: 5\' 5"  (1.651 m)       97% on RA BMI Readings from Last 3 Encounters:  06/24/22 33.78 kg/m  05/09/21 37.02 kg/m  03/27/21 37.16 kg/m   Wt Readings from Last 3 Encounters:  06/24/22 203 lb (92.1 kg)  05/09/21 209 lb (94.8 kg)  03/27/21 209 lb 12.8 oz (95.2 kg)     CBC No results found for: "WBC", "RBC", "HGB", "HCT", "PLT", "MCV", "MCH", "MCHC", "RDW", "LYMPHSABS", "MONOABS", "EOSABS", "BASOSABS"  11/21 Hb 13, Eos 300, bicarb 24, S Cr 0.99, TSH and fT4 WNL  Chest Imaging: CXR 01/23/21 reviewed by me unremarkable  Pulmonary Functions Testing Results:    Latest Ref Rng & Units 03/08/2021    1:03 PM 01/31/2021    3:12 PM  PFT Results  FVC-Pre L 2.90  2.86   FVC-Predicted Pre % 88  87   FVC-Post L 2.85  2.77   FVC-Predicted Post % 86  84   Pre FEV1/FVC % % 85  88   Post FEV1/FCV % % 86  90   FEV1-Pre L 2.47  2.52   FEV1-Predicted Pre % 96  98   FEV1-Post L 2.44  2.49    03/08/21 Methacholine challenge reviewed by me, negative for AHR. Throat irritation but normal FV loops  CPEX reviewed by me with normal exercise capacity but there is evidence of exercise related bronchoconstriction     Assessment & Plan:   # Exercise induced bronchoconstriction # Uncontrolled persistent asthma: CPEX and symptoms strongly suggestive of EIB but more persistent symptoms including nightly wheezing responsive to albuterol and now LABA/ICS in setting of longer duration exercise (biking 2-3 hours at a time), pollen exposure.   Plan: - encouraged warm up before exercise, if in cold then to wear loose-fitting scarf or mask over mouth - albuterol 1-2 puffs 5-20 minutes before strenuous  exercise with spacer AND can use as rescue after strenuous exercise if develops dyspnea, wheeze - would use symbicort 2 puffs twice daily till next visit, rinse mouth and brush teeth/tongue after each use - would do daytime dose of symbicort 20 min before exercise, evening dose after dinner or before bed unless it makes you too jittery to sleep - albuterol 1-2 puffs as needed - this is rescue inhaler - I will price out alternatives to symbicort - I have also prescribed singulair 10 mg daily in case you wish to try this instead of symbicort (or alternative LABA-ICS inhaler). Note low but present  risk for depressed mood/suicidality.  - can try nonsedating antihistamine if you wish in case pollen playing a role - see you 3 months or sooner if need be!  RTC 3 months    Omar Person, MD  P[facility] 06/24/2022 10:01 AM

## 2022-06-21 ENCOUNTER — Other Ambulatory Visit (HOSPITAL_BASED_OUTPATIENT_CLINIC_OR_DEPARTMENT_OTHER): Payer: Self-pay

## 2022-06-21 MED ORDER — TIRZEPATIDE-WEIGHT MANAGEMENT 2.5 MG/0.5ML ~~LOC~~ SOAJ
2.5000 mg | SUBCUTANEOUS | 2 refills | Status: AC
  Filled 2022-06-21: qty 2, 28d supply, fill #0
  Filled 2022-07-19: qty 2, 28d supply, fill #1

## 2022-06-24 ENCOUNTER — Ambulatory Visit (INDEPENDENT_AMBULATORY_CARE_PROVIDER_SITE_OTHER): Payer: Managed Care, Other (non HMO) | Admitting: Student

## 2022-06-24 ENCOUNTER — Encounter: Payer: Self-pay | Admitting: Student

## 2022-06-24 VITALS — BP 116/72 | HR 72 | Ht 65.0 in | Wt 203.0 lb

## 2022-06-24 DIAGNOSIS — J45998 Other asthma: Secondary | ICD-10-CM | POA: Diagnosis not present

## 2022-06-24 MED ORDER — MONTELUKAST SODIUM 10 MG PO TABS: 10.0000 mg | ORAL_TABLET | Freq: Every day | ORAL | 11 refills | Status: AC

## 2022-06-24 NOTE — Patient Instructions (Addendum)
-   would use symbicort 2 puffs twice daily till next visit, rinse mouth and brush teeth/tongue after each use - would do daytime dose of symbicort 20 min before exercise, evening dose after dinner or before bed unless it makes you too jittery to sleep - albuterol 1-2 puffs as needed - this is rescue inhaler - I will price out alternatives to symbicort - can try nonsedating antihistamine if you wish in case pollen playing a role - see you 3 months or sooner if need be!

## 2022-07-19 ENCOUNTER — Other Ambulatory Visit (HOSPITAL_BASED_OUTPATIENT_CLINIC_OR_DEPARTMENT_OTHER): Payer: Self-pay

## 2022-07-25 ENCOUNTER — Other Ambulatory Visit (HOSPITAL_BASED_OUTPATIENT_CLINIC_OR_DEPARTMENT_OTHER): Payer: Self-pay

## 2022-07-26 ENCOUNTER — Other Ambulatory Visit (HOSPITAL_BASED_OUTPATIENT_CLINIC_OR_DEPARTMENT_OTHER): Payer: Self-pay

## 2022-07-26 MED ORDER — ZEPBOUND 5 MG/0.5ML ~~LOC~~ SOAJ
5.0000 mg | SUBCUTANEOUS | 0 refills | Status: AC
  Filled 2022-07-26 (×2): qty 2, 28d supply, fill #0

## 2022-08-21 ENCOUNTER — Other Ambulatory Visit (HOSPITAL_BASED_OUTPATIENT_CLINIC_OR_DEPARTMENT_OTHER): Payer: Self-pay

## 2022-08-21 MED ORDER — ZEPBOUND 7.5 MG/0.5ML ~~LOC~~ SOAJ
7.5000 mg | SUBCUTANEOUS | 0 refills | Status: DC
  Filled 2022-08-21: qty 2, 28d supply, fill #0

## 2022-09-19 ENCOUNTER — Other Ambulatory Visit (HOSPITAL_BASED_OUTPATIENT_CLINIC_OR_DEPARTMENT_OTHER): Payer: Self-pay

## 2022-09-19 MED ORDER — ZEPBOUND 7.5 MG/0.5ML ~~LOC~~ SOAJ
7.5000 mg | SUBCUTANEOUS | 2 refills | Status: DC
  Filled 2022-09-19: qty 2, 28d supply, fill #0
  Filled 2022-10-17: qty 2, 28d supply, fill #1
  Filled 2022-11-15: qty 2, 28d supply, fill #2

## 2022-09-20 ENCOUNTER — Other Ambulatory Visit (HOSPITAL_BASED_OUTPATIENT_CLINIC_OR_DEPARTMENT_OTHER): Payer: Self-pay

## 2022-10-18 ENCOUNTER — Other Ambulatory Visit (HOSPITAL_BASED_OUTPATIENT_CLINIC_OR_DEPARTMENT_OTHER): Payer: Self-pay

## 2022-10-19 ENCOUNTER — Other Ambulatory Visit (HOSPITAL_BASED_OUTPATIENT_CLINIC_OR_DEPARTMENT_OTHER): Payer: Self-pay

## 2022-11-15 ENCOUNTER — Other Ambulatory Visit (HOSPITAL_BASED_OUTPATIENT_CLINIC_OR_DEPARTMENT_OTHER): Payer: Self-pay

## 2022-12-17 ENCOUNTER — Other Ambulatory Visit (HOSPITAL_BASED_OUTPATIENT_CLINIC_OR_DEPARTMENT_OTHER): Payer: Self-pay

## 2022-12-17 MED ORDER — ZEPBOUND 10 MG/0.5ML ~~LOC~~ SOAJ
10.0000 mg | SUBCUTANEOUS | 1 refills | Status: AC
  Filled 2023-01-24: qty 2, 28d supply, fill #0
  Filled 2023-02-20: qty 2, 28d supply, fill #1

## 2022-12-17 MED ORDER — ZEPBOUND 7.5 MG/0.5ML ~~LOC~~ SOAJ
7.5000 mg | SUBCUTANEOUS | 0 refills | Status: AC
  Filled 2022-12-17: qty 2, 28d supply, fill #0

## 2023-01-24 ENCOUNTER — Other Ambulatory Visit (HOSPITAL_BASED_OUTPATIENT_CLINIC_OR_DEPARTMENT_OTHER): Payer: Self-pay

## 2023-01-27 ENCOUNTER — Other Ambulatory Visit (HOSPITAL_BASED_OUTPATIENT_CLINIC_OR_DEPARTMENT_OTHER): Payer: Self-pay

## 2023-01-28 ENCOUNTER — Other Ambulatory Visit (HOSPITAL_BASED_OUTPATIENT_CLINIC_OR_DEPARTMENT_OTHER): Payer: Self-pay

## 2023-01-31 ENCOUNTER — Other Ambulatory Visit: Payer: Self-pay | Admitting: Plastic Surgery

## 2023-01-31 DIAGNOSIS — N644 Mastodynia: Secondary | ICD-10-CM

## 2023-02-20 ENCOUNTER — Other Ambulatory Visit (HOSPITAL_BASED_OUTPATIENT_CLINIC_OR_DEPARTMENT_OTHER): Payer: Self-pay

## 2023-02-24 ENCOUNTER — Ambulatory Visit
Admission: RE | Admit: 2023-02-24 | Discharge: 2023-02-24 | Disposition: A | Discharge status: Home / Self Care | Payer: Managed Care, Other (non HMO) | Source: Ambulatory Visit | Attending: Plastic Surgery | Admitting: Plastic Surgery

## 2023-02-24 ENCOUNTER — Other Ambulatory Visit (HOSPITAL_BASED_OUTPATIENT_CLINIC_OR_DEPARTMENT_OTHER): Payer: Self-pay

## 2023-02-24 DIAGNOSIS — N644 Mastodynia: Secondary | ICD-10-CM

## 2023-02-24 MED ORDER — ZEPBOUND 10 MG/0.5ML ~~LOC~~ SOAJ
10.0000 mg | SUBCUTANEOUS | 1 refills | Status: DC
  Filled 2023-02-24 – 2023-04-03 (×2): qty 2, 28d supply, fill #0
  Filled 2023-04-30: qty 2, 28d supply, fill #1

## 2023-04-03 ENCOUNTER — Other Ambulatory Visit (HOSPITAL_BASED_OUTPATIENT_CLINIC_OR_DEPARTMENT_OTHER): Payer: Self-pay

## 2023-04-30 ENCOUNTER — Other Ambulatory Visit (HOSPITAL_BASED_OUTPATIENT_CLINIC_OR_DEPARTMENT_OTHER): Payer: Self-pay

## 2023-05-01 ENCOUNTER — Other Ambulatory Visit (HOSPITAL_BASED_OUTPATIENT_CLINIC_OR_DEPARTMENT_OTHER): Payer: Self-pay

## 2023-06-12 ENCOUNTER — Other Ambulatory Visit (HOSPITAL_BASED_OUTPATIENT_CLINIC_OR_DEPARTMENT_OTHER): Payer: Self-pay

## 2023-06-12 ENCOUNTER — Other Ambulatory Visit: Payer: Self-pay

## 2023-06-12 MED ORDER — ZEPBOUND 10 MG/0.5ML ~~LOC~~ SOAJ
10.0000 mg | SUBCUTANEOUS | 1 refills | Status: DC
  Filled 2023-06-12: qty 2, 28d supply, fill #0
  Filled 2023-07-05: qty 2, 28d supply, fill #1

## 2023-07-01 IMAGING — US US BREAST*R* LIMITED INC AXILLA
1 series · 9 of 9 positions shown · non-contrast
Comparison: Previous exam(s).

CLINICAL DATA: Screening recall for possible masses in the right
breast. The patient has seen lean implants. This is her [DATE] of
implants.

EXAM:
DIGITAL DIAGNOSTIC UNILATERAL RIGHT MAMMOGRAM WITH IMPLANTS, CAD AND
TOMOSYNTHESIS; ULTRASOUND RIGHT BREAST LIMITED
TECHNIQUE: Right digital diagnostic mammography and breast tomosynthesis was
performed. The images were evaluated with computer-aided detection.
Standard and/or implant displaced views were performed.; Targeted
ultrasound examination of the right breast was performed

[Series 1: us breast*right* limited inc axilla · 0.06mm/px · 9 of 9 slices shown]
[im 1/9]
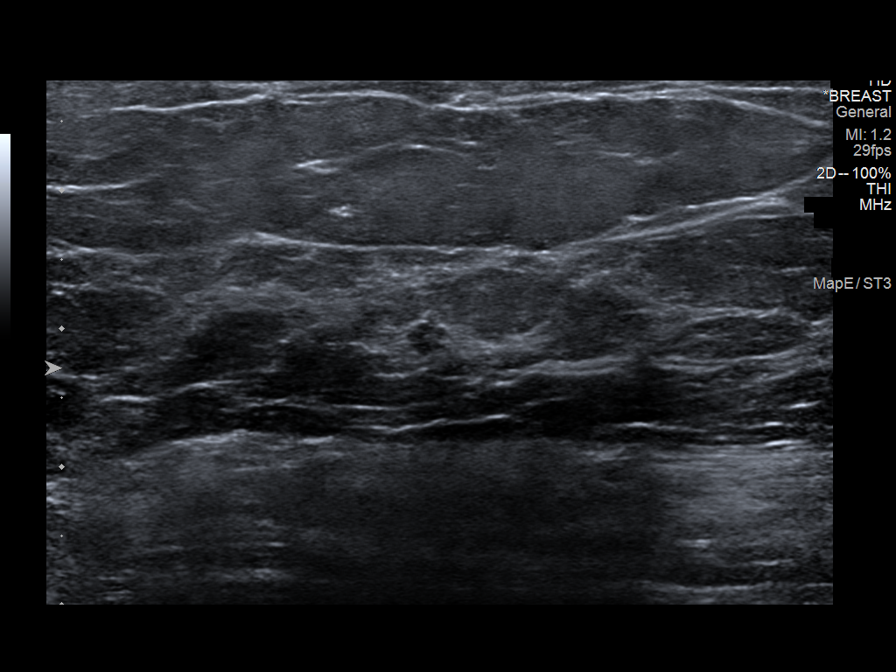
[im 2/9]
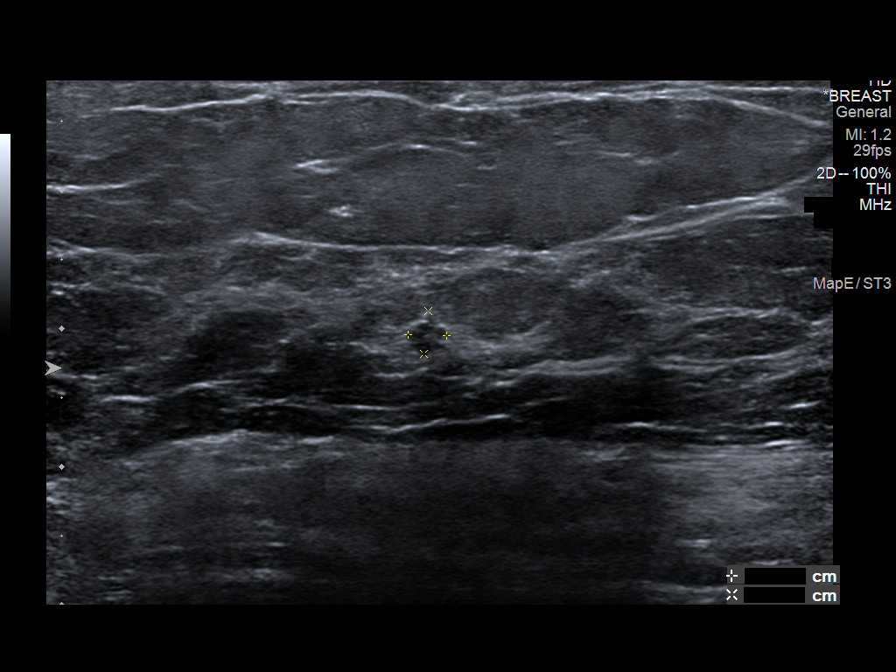
[im 3/9]
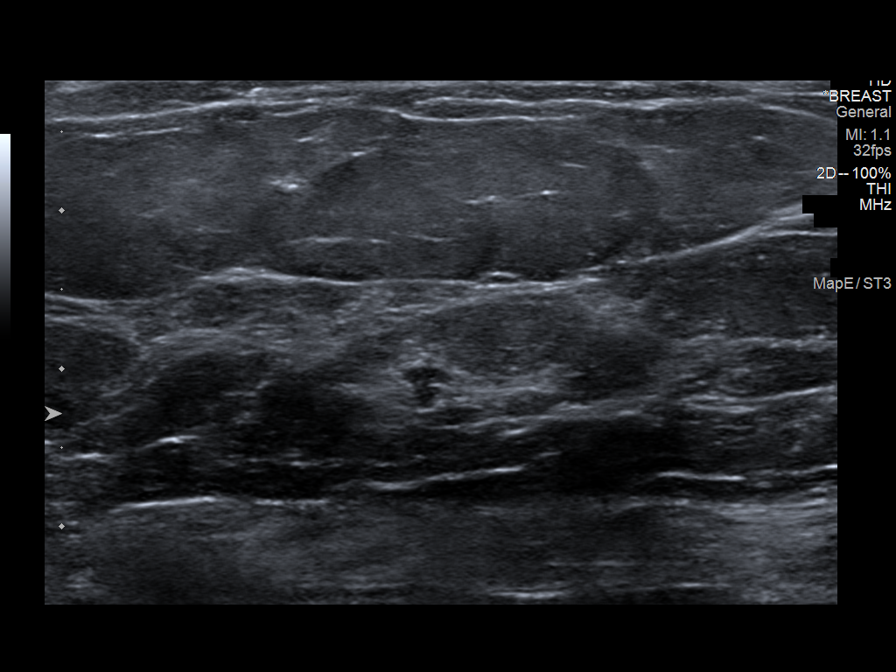
[im 4/9]
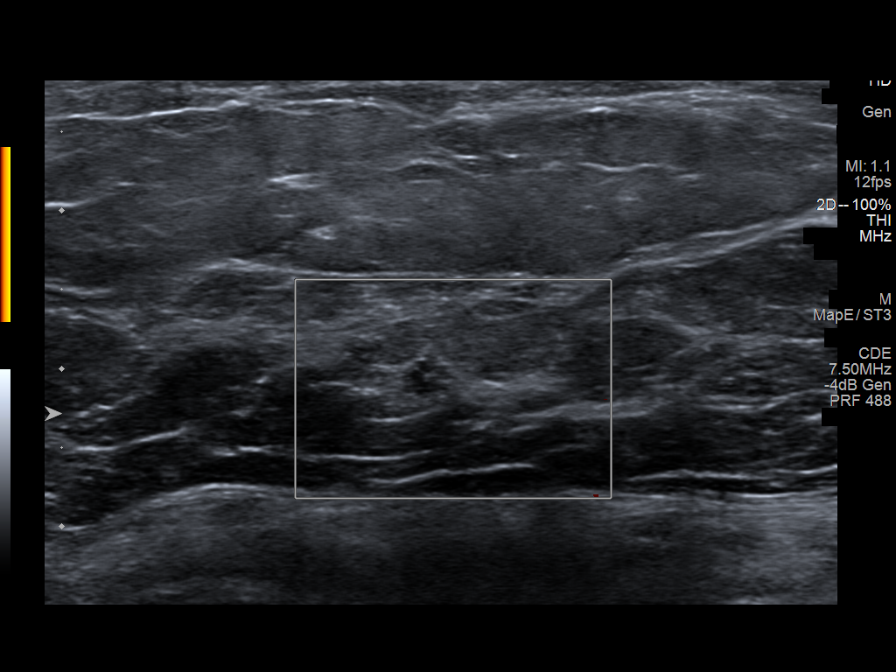
[im 5/9]
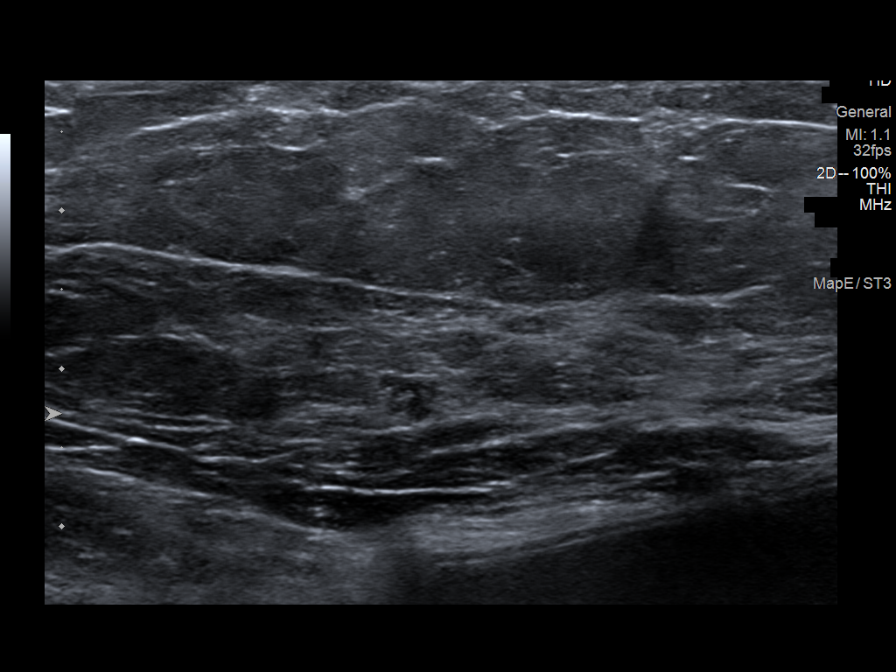
[im 6/9]
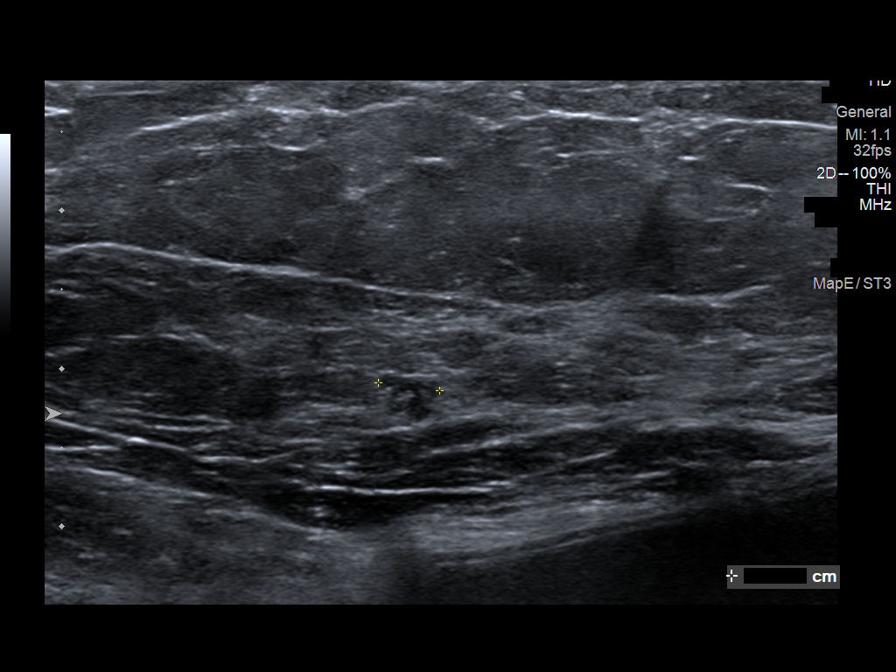
[im 7/9]
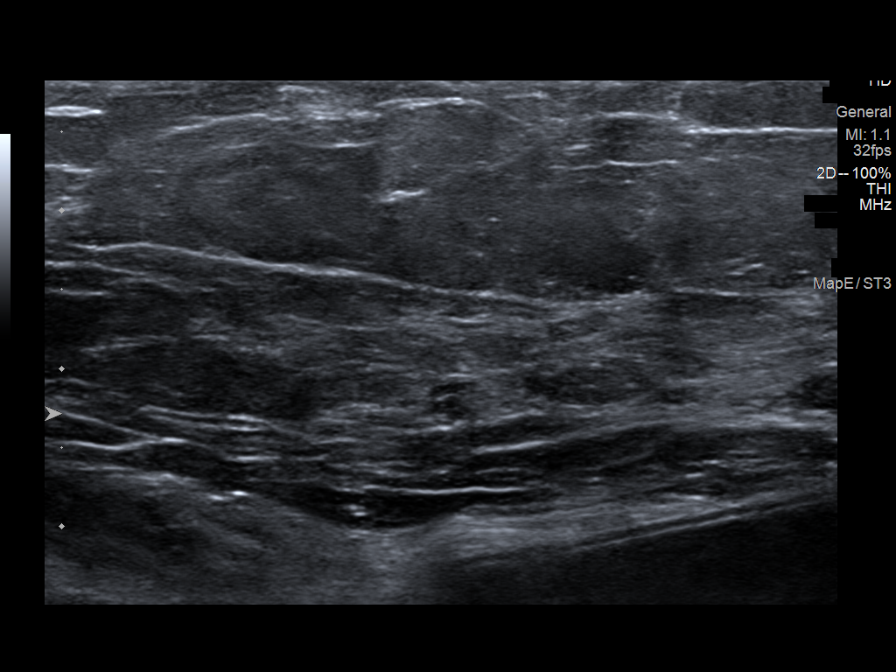
[im 8/9]
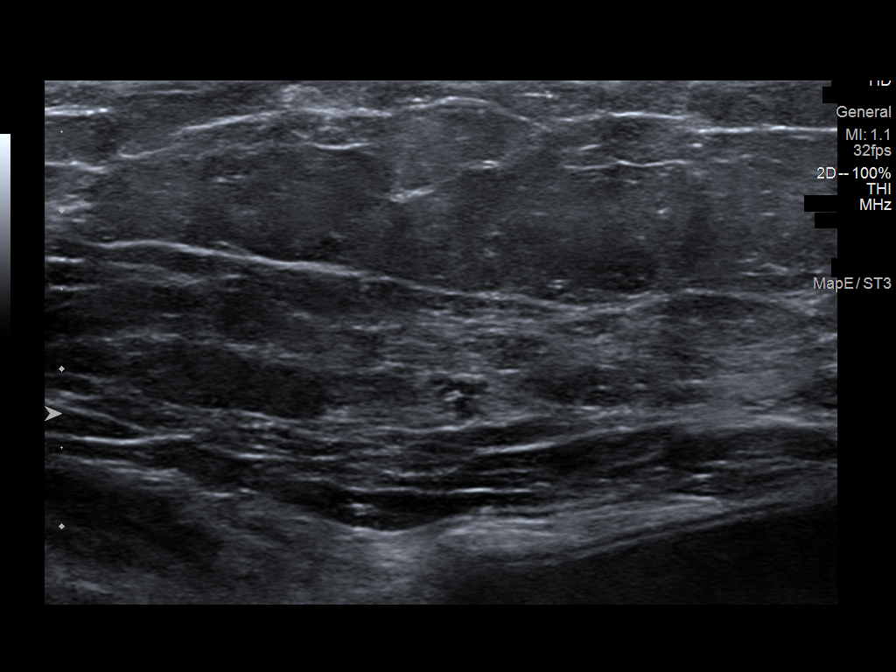
[im 9/9]
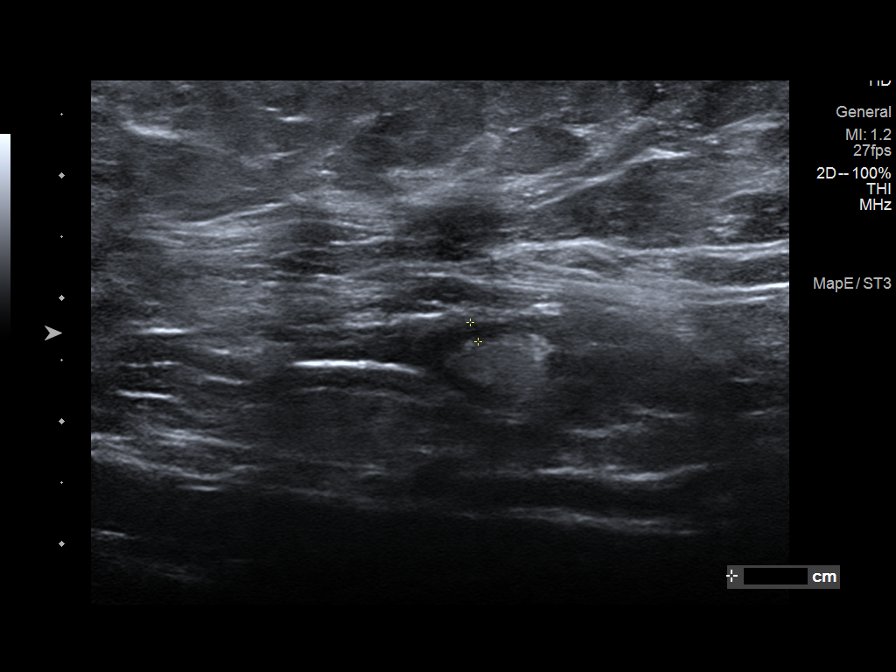

[9 of 9 positions shown; findings below may reference images not displayed]

ACR Breast Density Category b: There are scattered areas of
fibroglandular density.
FINDINGS: Spot compression tomosynthesis images through the superior posterior
right breast demonstrates two 4 mm masses the upper-outer quadrant
adjacent to the implant. The patient has retropectoral implants.

Ultrasound of the right breast at 12 o'clock, 4 cm from the nipple
demonstrates a 4 x 3 x 3 mm irregular hypoechoic mass. The second
mass seen mammographically is not visualized. Ultrasound of the
right axilla demonstrates
IMPRESSION: 1. There is an indeterminate 4 mm mass in the right breast at 12
o'clock.

2. There is a 4 mm sonographically occult mass lateral to the right
breast mass at 12 o'clock.

3.  No evidence of right axillary lymphadenopathy.

RECOMMENDATION:
1. Ultrasound guided biopsy is recommended for the right breast
mass. This has been scheduled for 04/26/2021 at [DATE] p.m.

2. If the biopsy results are benign, then a six-month follow-up
right breast mammogram is recommended for the second sonographically
occult mass. If the biopsy is abnormal, than stereotactic biopsy for
the second mass is recommended.

I have discussed the findings and recommendations with the patient.
If applicable, a reminder letter will be sent to the patient
regarding the next appointment.

BI-RADS CATEGORY  4: Suspicious.

## 2023-07-01 IMAGING — MG MM DIGITAL DIAGNOSTIC UNILAT*R* IMPLANT W/ TOMO W/ CAD
4 series · 4 of 12 positions shown · non-contrast
Comparison: Previous exam(s).

CLINICAL DATA: Screening recall for possible masses in the right
breast. The patient has seen lean implants. This is her [DATE] of
implants.

EXAM:
DIGITAL DIAGNOSTIC UNILATERAL RIGHT MAMMOGRAM WITH IMPLANTS, CAD AND
TOMOSYNTHESIS; ULTRASOUND RIGHT BREAST LIMITED
TECHNIQUE: Right digital diagnostic mammography and breast tomosynthesis was
performed. The images were evaluated with computer-aided detection.
Standard and/or implant displaced views were performed.; Targeted
ultrasound examination of the right breast was performed

[R MLO synth-2D]
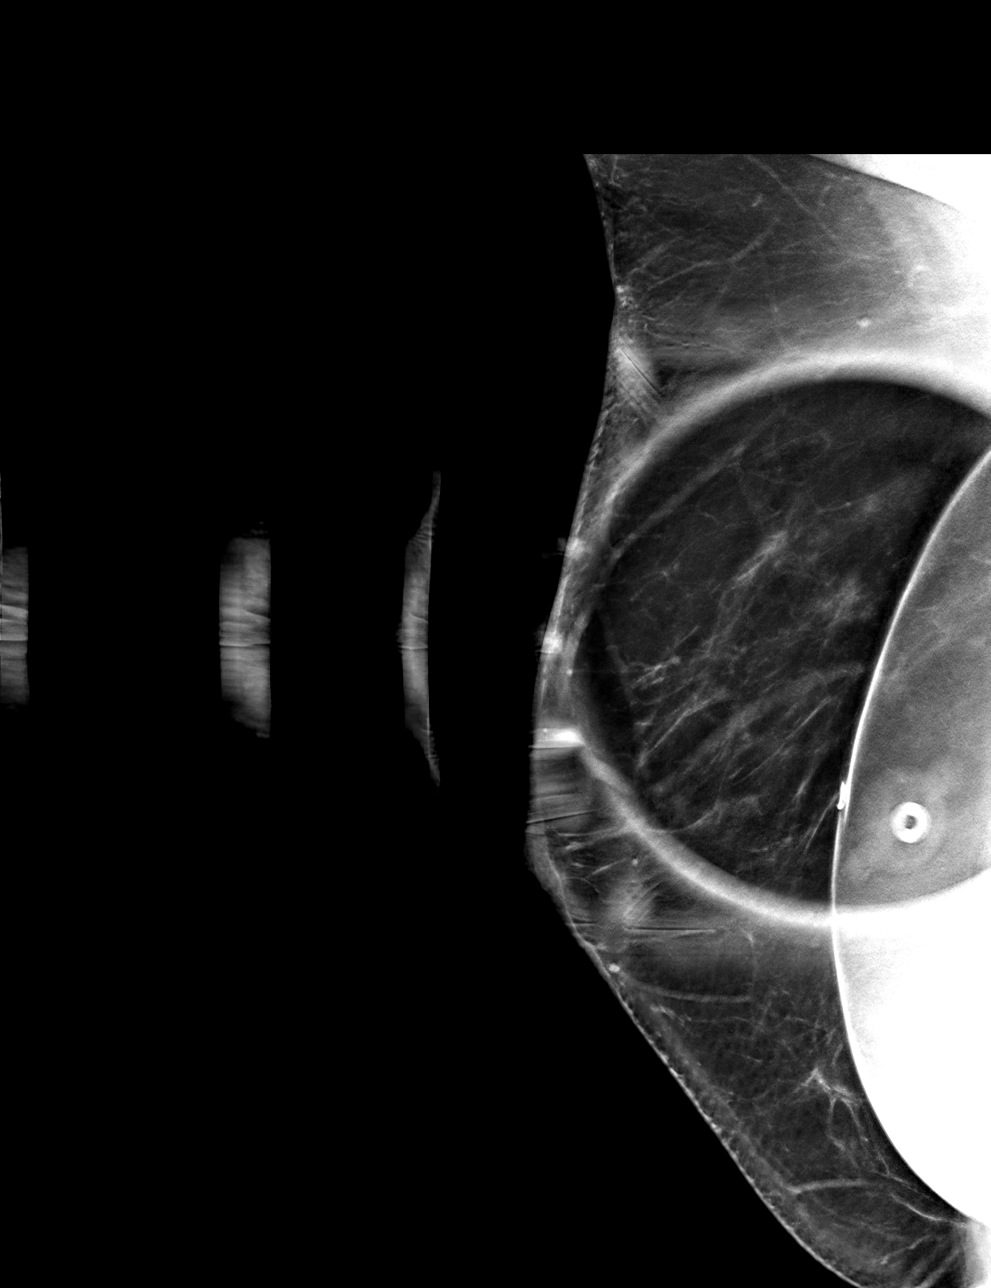

[R ML synth-2D]
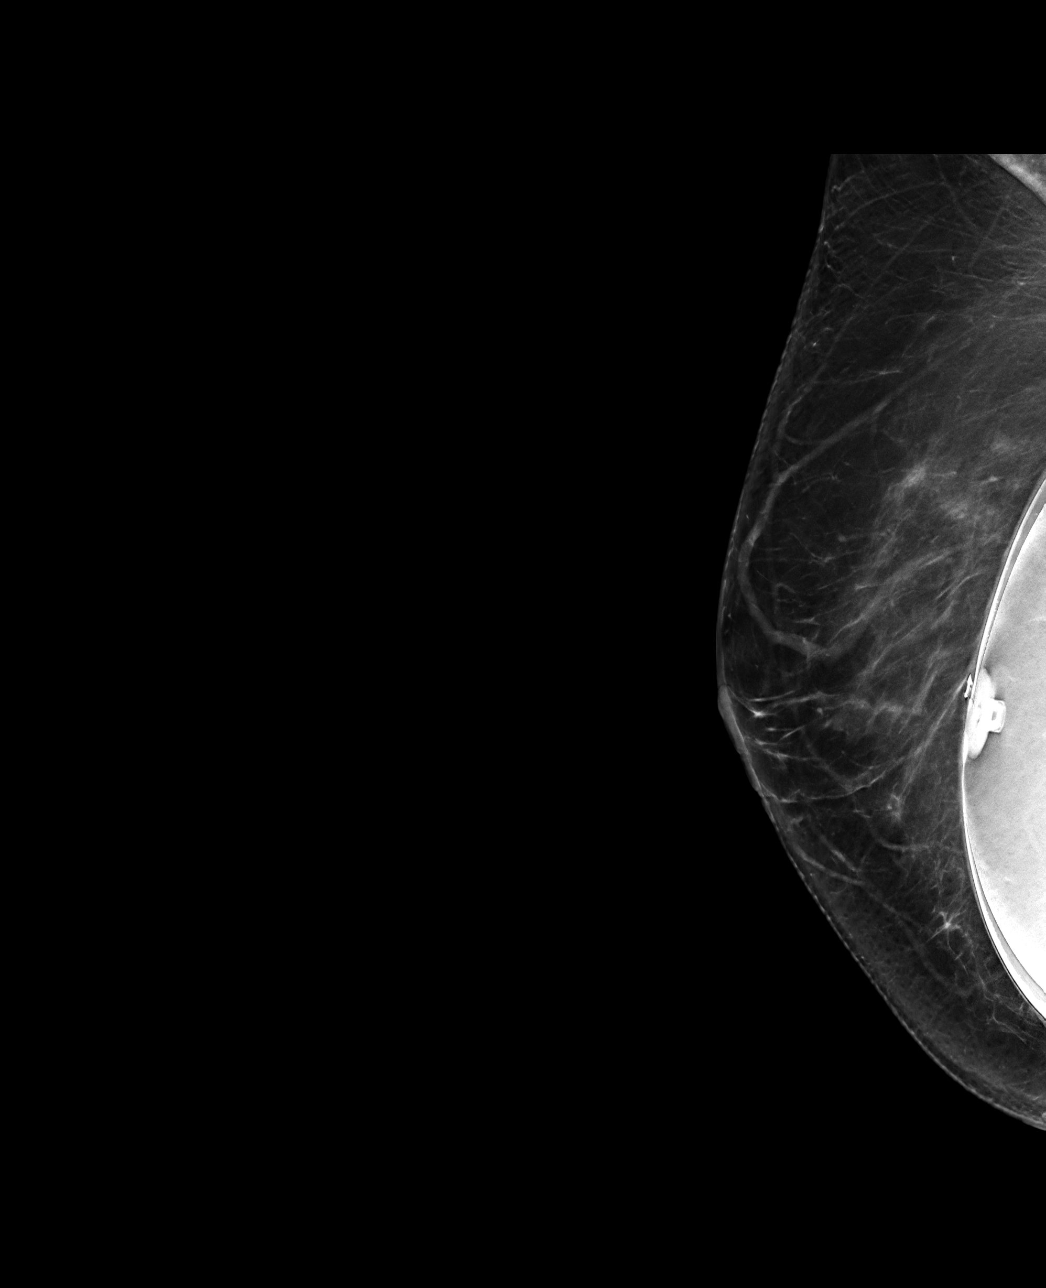

[R MLOID BREAST TOMOSYNTHESIS IMAGE tomo · tomo slice 41/82.0]
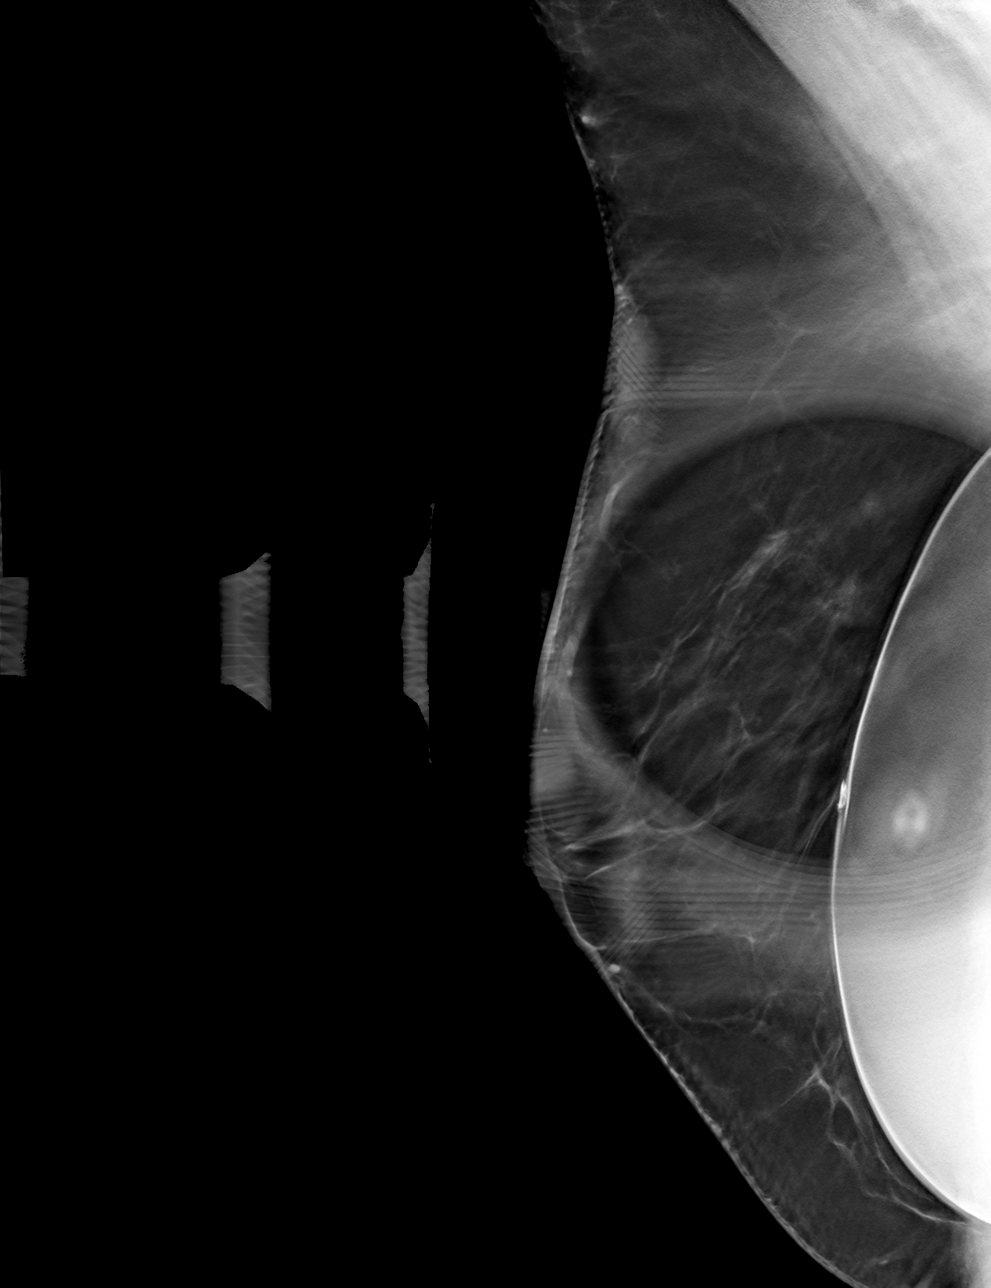

[R MLID BREAST TOMOSYNTHESIS IMAGE tomo · tomo slice 41/80.0]
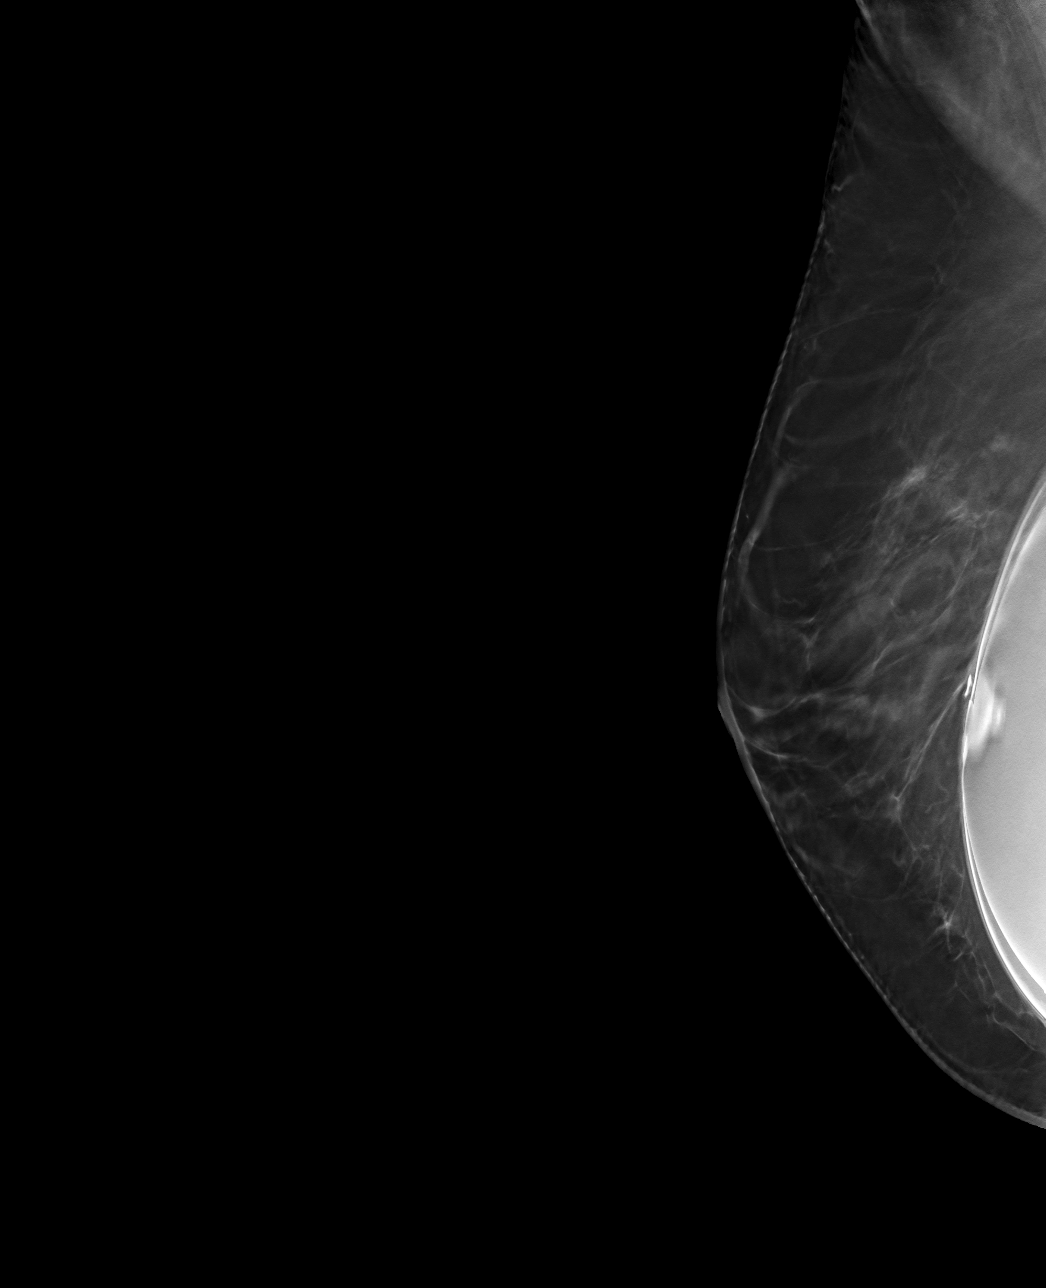

[4 of 12 positions shown; findings below may reference images not displayed]

ACR Breast Density Category b: There are scattered areas of
fibroglandular density.
FINDINGS: Spot compression tomosynthesis images through the superior posterior
right breast demonstrates two 4 mm masses the upper-outer quadrant
adjacent to the implant. The patient has retropectoral implants.

Ultrasound of the right breast at 12 o'clock, 4 cm from the nipple
demonstrates a 4 x 3 x 3 mm irregular hypoechoic mass. The second
mass seen mammographically is not visualized. Ultrasound of the
right axilla demonstrates
IMPRESSION: 1. There is an indeterminate 4 mm mass in the right breast at 12
o'clock.

2. There is a 4 mm sonographically occult mass lateral to the right
breast mass at 12 o'clock.

3.  No evidence of right axillary lymphadenopathy.

RECOMMENDATION:
1. Ultrasound guided biopsy is recommended for the right breast
mass. This has been scheduled for 04/26/2021 at [DATE] p.m.

2. If the biopsy results are benign, then a six-month follow-up
right breast mammogram is recommended for the second sonographically
occult mass. If the biopsy is abnormal, than stereotactic biopsy for
the second mass is recommended.

I have discussed the findings and recommendations with the patient.
If applicable, a reminder letter will be sent to the patient
regarding the next appointment.

BI-RADS CATEGORY  4: Suspicious.

## 2023-07-05 ENCOUNTER — Other Ambulatory Visit (HOSPITAL_BASED_OUTPATIENT_CLINIC_OR_DEPARTMENT_OTHER): Payer: Self-pay

## 2023-08-21 ENCOUNTER — Other Ambulatory Visit: Payer: Self-pay

## 2023-08-21 ENCOUNTER — Other Ambulatory Visit (HOSPITAL_BASED_OUTPATIENT_CLINIC_OR_DEPARTMENT_OTHER): Payer: Self-pay

## 2023-08-21 MED ORDER — ZEPBOUND 12.5 MG/0.5ML ~~LOC~~ SOAJ
12.5000 mg | SUBCUTANEOUS | 0 refills | Status: AC
  Filled 2023-08-21: qty 2, 28d supply, fill #0

## 2023-09-25 ENCOUNTER — Other Ambulatory Visit (HOSPITAL_BASED_OUTPATIENT_CLINIC_OR_DEPARTMENT_OTHER): Payer: Self-pay

## 2023-09-25 MED ORDER — ZEPBOUND 10 MG/0.5ML ~~LOC~~ SOAJ
10.0000 mg | SUBCUTANEOUS | 1 refills | Status: DC
  Filled 2023-09-25: qty 2, 28d supply, fill #0
  Filled 2023-10-28: qty 2, 28d supply, fill #1

## 2023-10-28 ENCOUNTER — Other Ambulatory Visit (HOSPITAL_BASED_OUTPATIENT_CLINIC_OR_DEPARTMENT_OTHER): Payer: Self-pay

## 2023-11-26 ENCOUNTER — Other Ambulatory Visit (HOSPITAL_BASED_OUTPATIENT_CLINIC_OR_DEPARTMENT_OTHER): Payer: Self-pay

## 2023-11-26 MED ORDER — ZEPBOUND 10 MG/0.5ML ~~LOC~~ SOAJ
10.0000 mg | SUBCUTANEOUS | 2 refills | Status: DC
  Filled 2023-11-26: qty 2, 28d supply, fill #0
  Filled 2023-12-27 (×2): qty 2, 28d supply, fill #1
  Filled 2024-01-30: qty 2, 28d supply, fill #2

## 2023-12-27 ENCOUNTER — Other Ambulatory Visit (HOSPITAL_BASED_OUTPATIENT_CLINIC_OR_DEPARTMENT_OTHER): Payer: Self-pay

## 2024-01-01 ENCOUNTER — Other Ambulatory Visit (HOSPITAL_BASED_OUTPATIENT_CLINIC_OR_DEPARTMENT_OTHER): Payer: Self-pay

## 2024-01-30 ENCOUNTER — Other Ambulatory Visit (HOSPITAL_BASED_OUTPATIENT_CLINIC_OR_DEPARTMENT_OTHER): Payer: Self-pay

## 2024-01-31 ENCOUNTER — Other Ambulatory Visit (HOSPITAL_BASED_OUTPATIENT_CLINIC_OR_DEPARTMENT_OTHER): Payer: Self-pay

## 2024-02-23 ENCOUNTER — Other Ambulatory Visit (HOSPITAL_BASED_OUTPATIENT_CLINIC_OR_DEPARTMENT_OTHER): Payer: Self-pay

## 2024-02-23 MED ORDER — ZEPBOUND 10 MG/0.5ML ~~LOC~~ SOAJ
10.0000 mg | SUBCUTANEOUS | 2 refills | Status: AC
  Filled 2024-02-23: qty 2, 28d supply, fill #0

## 2024-02-28 ENCOUNTER — Other Ambulatory Visit (HOSPITAL_BASED_OUTPATIENT_CLINIC_OR_DEPARTMENT_OTHER): Payer: Self-pay
